# Patient Record
Sex: Female | Born: 1970
Health system: Southern US, Community
[De-identification: ages and names within clinical notes are randomized; demographics above are authoritative.]

## PROBLEM LIST (undated history)

## (undated) DIAGNOSIS — E118 Type 2 diabetes mellitus with unspecified complications: Secondary | ICD-10-CM

## (undated) DIAGNOSIS — I10 Essential (primary) hypertension: Secondary | ICD-10-CM

## (undated) DIAGNOSIS — K219 Gastro-esophageal reflux disease without esophagitis: Secondary | ICD-10-CM

## (undated) DIAGNOSIS — E039 Hypothyroidism, unspecified: Secondary | ICD-10-CM

## (undated) DIAGNOSIS — N926 Irregular menstruation, unspecified: Secondary | ICD-10-CM

## (undated) HISTORY — DX: Hypothyroidism, unspecified: E03.9

## (undated) HISTORY — DX: Gastro-esophageal reflux disease without esophagitis: K21.9

## (undated) HISTORY — DX: Irregular menstruation, unspecified: N92.6

## (undated) HISTORY — DX: Essential (primary) hypertension: I10

---

## 2005-11-13 ENCOUNTER — Ambulatory Visit: Payer: Self-pay

## 2005-12-01 ENCOUNTER — Other Ambulatory Visit: Payer: Self-pay

## 2005-12-01 ENCOUNTER — Emergency Department: Payer: Self-pay | Admitting: Emergency Medicine

## 2006-04-27 ENCOUNTER — Ambulatory Visit: Payer: Self-pay | Admitting: Internal Medicine

## 2006-05-01 ENCOUNTER — Ambulatory Visit: Payer: Self-pay | Admitting: Internal Medicine

## 2006-07-27 ENCOUNTER — Ambulatory Visit: Payer: Self-pay | Admitting: Internal Medicine

## 2007-07-01 ENCOUNTER — Ambulatory Visit: Payer: Self-pay | Admitting: Internal Medicine

## 2008-01-17 ENCOUNTER — Ambulatory Visit: Payer: Self-pay | Admitting: Family Medicine

## 2008-03-20 ENCOUNTER — Ambulatory Visit: Payer: Self-pay | Admitting: Internal Medicine

## 2008-07-27 ENCOUNTER — Ambulatory Visit: Payer: Self-pay | Admitting: Internal Medicine

## 2008-08-14 ENCOUNTER — Ambulatory Visit: Payer: Self-pay | Admitting: Internal Medicine

## 2008-10-13 ENCOUNTER — Ambulatory Visit: Payer: Self-pay | Admitting: Internal Medicine

## 2008-11-10 ENCOUNTER — Ambulatory Visit: Payer: Self-pay | Admitting: Gastroenterology

## 2010-02-11 ENCOUNTER — Ambulatory Visit: Payer: Self-pay | Admitting: Internal Medicine

## 2011-02-13 ENCOUNTER — Ambulatory Visit: Payer: Self-pay

## 2011-02-21 ENCOUNTER — Ambulatory Visit: Payer: Self-pay

## 2011-03-14 ENCOUNTER — Ambulatory Visit: Payer: Self-pay

## 2012-02-20 ENCOUNTER — Ambulatory Visit: Payer: Self-pay

## 2013-02-26 ENCOUNTER — Ambulatory Visit: Payer: Self-pay

## 2014-02-27 ENCOUNTER — Ambulatory Visit: Payer: Self-pay

## 2014-10-01 ENCOUNTER — Other Ambulatory Visit: Payer: Self-pay

## 2014-10-01 DIAGNOSIS — N83202 Unspecified ovarian cyst, left side: Secondary | ICD-10-CM

## 2014-10-02 ENCOUNTER — Ambulatory Visit (INDEPENDENT_AMBULATORY_CARE_PROVIDER_SITE_OTHER): Payer: 59 | Admitting: Obstetrics and Gynecology

## 2014-10-02 ENCOUNTER — Encounter: Payer: Self-pay | Admitting: Obstetrics and Gynecology

## 2014-10-02 ENCOUNTER — Ambulatory Visit: Payer: 59

## 2014-10-02 VITALS — BP 118/77 | HR 87 | Ht 66.0 in | Wt 128.9 lb

## 2014-10-02 DIAGNOSIS — N8320 Unspecified ovarian cysts: Secondary | ICD-10-CM

## 2014-10-02 DIAGNOSIS — D259 Leiomyoma of uterus, unspecified: Secondary | ICD-10-CM | POA: Insufficient documentation

## 2014-10-02 DIAGNOSIS — N83202 Unspecified ovarian cyst, left side: Secondary | ICD-10-CM

## 2014-10-02 DIAGNOSIS — E039 Hypothyroidism, unspecified: Secondary | ICD-10-CM

## 2014-10-02 DIAGNOSIS — N926 Irregular menstruation, unspecified: Secondary | ICD-10-CM

## 2014-10-02 DIAGNOSIS — I10 Essential (primary) hypertension: Secondary | ICD-10-CM | POA: Diagnosis not present

## 2014-10-02 NOTE — Patient Instructions (Signed)
Follow up as needed for menstrual irregularities.

## 2014-10-02 NOTE — Progress Notes (Signed)
   Subjective:    Patient ID: Rebekah Mclean, female    DOB: 1971/03/14, 44 y.o.   MRN: [id number]  HPI Comments: Patient presents for follow up of results of ultrasound of left ovarian cyst.  Still notes occasional "twinges" of pain, mild on left side, but otherwise doing well.     Review of Systems  All other systems reviewed and are negative.      Objective:   Physical Exam  Vitals reviewed. Exam deferred.   Pelvic Ultrasound Imaging (10/02/14):   Indications: Left ovarian cyst seen on outside imaging Findings:  The uterus measures 8.3 x 5.4 x 4.7 cm.  Echo texture is homogenous with evidence of a focal mass seen in the left fundus. Within the uterus is a suspected fibroid measuring: Fibroid 1: 3.5 x 2.4 x 3.7 cm, subserosal, left fundus.  The Endometrium measures 8.2 mm.  Right Ovary measures 3.6 x 1.3 x 1.9 cm. It is normal in appearance. Left Ovary measures 3.0 x 2.0 x 2.3 cm. A dominant follicle measuring 1.2 cm is seen. Survey of the adnexa demonstrates no adnexal masses. There is no free fluid in the cul de sac.  Impression: 1. Left fundal fibroid, subserosal. 2. Dominant left follicle. No large cystic structure seen on today's exam  Recommendations: 1.Clinical correlation with the patient's History and Physical Exam.     Assessment & Plan:  1) Left ovarian cyst - resolved.  Small dominant follicle seen.  2) Fibroid uterus - small fibroid noted.  No need for intervention at this time.  3) Patient previously with h/o 1 episode of irregular menses.  To f/u if continued irregularities.   Follow up as needed.    [name], MD [facility] 10/02/2014 3:01 PM

## 2015-03-29 ENCOUNTER — Other Ambulatory Visit: Payer: Self-pay | Admitting: Nurse Practitioner

## 2015-03-29 DIAGNOSIS — Z1231 Encounter for screening mammogram for malignant neoplasm of breast: Secondary | ICD-10-CM

## 2015-04-12 ENCOUNTER — Ambulatory Visit
Admission: RE | Admit: 2015-04-12 | Discharge: 2015-04-12 | Disposition: A | Discharge status: Home / Self Care | Payer: 59 | Source: Ambulatory Visit | Attending: Nurse Practitioner | Admitting: Nurse Practitioner

## 2015-04-12 DIAGNOSIS — Z1231 Encounter for screening mammogram for malignant neoplasm of breast: Secondary | ICD-10-CM | POA: Diagnosis present

## 2016-03-08 ENCOUNTER — Other Ambulatory Visit: Payer: Self-pay | Admitting: Internal Medicine

## 2016-03-08 DIAGNOSIS — Z1231 Encounter for screening mammogram for malignant neoplasm of breast: Secondary | ICD-10-CM

## 2016-04-18 ENCOUNTER — Ambulatory Visit
Admission: RE | Admit: 2016-04-18 | Discharge: 2016-04-18 | Disposition: A | Discharge status: Home / Self Care | Payer: 59 | Source: Ambulatory Visit | Attending: Internal Medicine | Admitting: Internal Medicine

## 2016-04-18 DIAGNOSIS — Z1231 Encounter for screening mammogram for malignant neoplasm of breast: Secondary | ICD-10-CM | POA: Insufficient documentation

## 2016-05-25 ENCOUNTER — Ambulatory Visit (INDEPENDENT_AMBULATORY_CARE_PROVIDER_SITE_OTHER): Payer: 59 | Admitting: Obstetrics and Gynecology

## 2016-05-25 ENCOUNTER — Encounter: Payer: Self-pay | Admitting: Obstetrics and Gynecology

## 2016-05-25 VITALS — BP 128/82 | HR 98 | Ht 66.0 in | Wt 135.4 lb

## 2016-05-25 DIAGNOSIS — N920 Excessive and frequent menstruation with regular cycle: Secondary | ICD-10-CM | POA: Diagnosis not present

## 2016-05-25 DIAGNOSIS — D259 Leiomyoma of uterus, unspecified: Secondary | ICD-10-CM | POA: Diagnosis not present

## 2016-05-25 NOTE — Progress Notes (Signed)
GYNECOLOGY PROGRESS NOTE  Subjective:    Patient ID: Rebekah Mclean, female    DOB: Feb 13, 1971, 46 y.o.   MRN: [id number]  HPI  Patient is a 46 y.o. G63P1001 female who presents for referral from PCP (Dr. Clayborn Bigness, [facility]) for ultrasound findings of fibroid enlargement and patient's complaints of heavier cycle. Patient has a h/o fibroids, was seen approximately 1.5 years ago for a left ovarian cyst where an ultrasound diagnosed her with fibroids.  At the time, the fibroids were not bothersome and menstrual cycle was regular.  Today patient notes that her periods have been heavier over the past several months.  Usually changes a pad q 2-3 hours, now changing one every 1.5 hours with passage of small clots.  States that this is not that bothersome to her.      The following portions of the patient's history were reviewed and updated as appropriate: allergies, current medications, past family history, past medical history, past social history, past surgical history and problem list.  Review of Systems Pertinent items are noted in HPI.   Objective:   Blood pressure 128/82, pulse 98, height 5\' 6"  (1.676 m), weight 135 lb 6.4 oz (61.4 kg), last menstrual period 05/24/2016. General appearance: alert and no distress Abdomen: soft, non-tender; bowel sounds normal; no masses,  no organomegaly Pelvic: external genitalia normal, rectovaginal septum normal.  Vagina without discharge.  Cervix normal appearing, no lesions and no motion tenderness.  Uterus mobile, nontender, normal shape and size.  Adnexae non-palpable, nontender bilaterally.  Extremities: extremities normal, atraumatic, no cyanosis or edema Neurologic: Grossly normal   Imaging:  Pelvic Ultrasound Imaging (03/02/2016):   Report: Uterus does not appear enlarged. There is a 4 cm fibroid in left side of the uterus.  Endometrial thickness appears to be normal, 2.6 millimeters. Ovaries are unremarkable.  No pelvic masses or  free fluid identified.     Pelvic Ultrasound Imaging (10/02/14):   Indications: Left ovarian cyst seen on outside imaging Findings:  The uterus measures 8.3 x 5.4 x 4.7 cm.  Echo texture is homogenous with evidence of a focal mass seen in the left fundus. Within the uterus is a suspected fibroid measuring: Fibroid 1: 3.5 x 2.4 x 3.7 cm, subserosal, left fundus.  The Endometrium measures 8.2 mm.  Right Ovary measures 3.6 x 1.3 x 1.9 cm. It is normal in appearance. Left Ovary measures 3.0 x 2.0 x 2.3 cm. A dominant follicle measuring 1.2 cm is seen. Survey of the adnexa demonstrates no adnexal masses. There is no free fluid in the cul de sac.  Impression: 1. Left fundal fibroid, subserosal. 2. Dominant left follicle. No large cystic structure seen on today's exam  Recommendations: 1.Clinical correlation with the patient's History and Physical Exam.  Assessment:   Fibroid uterus Menorrhagia  Plan:   1. Fibroid uterus, with only slight change in size of over the past year ( from ~ 3.5 cm to 4 cm).  Is no cause for concern if not bothersome.  2. Menorrhagia reported by patient, noting an increase in flow over the past few months.  Patient notes that although she is aware of the change, it is not bothersome enough that she would desire management. Advised that bleeding may be secondary to perimenopausal bleeding, or due to the fibroid.  Discussed management options with patient, given handout to review.  Patient reportedly had labs from PCP that she has not yet had performed (including CBC and TSH). Encouraged patient to  have labs performed as soon as possible, and to have a copy sent to the office. Once labs are received, patient desires to discuss further management. Will likely need further evaluation with endometrial biopsy prior to initiation of therapy.    [name], MD [facility]

## 2016-08-16 ENCOUNTER — Encounter: Payer: Self-pay | Admitting: *Deleted

## 2016-08-16 ENCOUNTER — Ambulatory Visit
Admission: EM | Admit: 2016-08-16 | Discharge: 2016-08-16 | Disposition: A | Discharge status: Home / Self Care | Payer: 59 | Attending: Family Medicine | Admitting: Family Medicine

## 2016-08-16 DIAGNOSIS — R0981 Nasal congestion: Secondary | ICD-10-CM

## 2016-08-16 DIAGNOSIS — J301 Allergic rhinitis due to pollen: Secondary | ICD-10-CM

## 2016-08-16 DIAGNOSIS — R05 Cough: Secondary | ICD-10-CM

## 2016-08-16 DIAGNOSIS — J029 Acute pharyngitis, unspecified: Secondary | ICD-10-CM | POA: Diagnosis not present

## 2016-08-16 DIAGNOSIS — R059 Cough, unspecified: Secondary | ICD-10-CM

## 2016-08-16 LAB — RAPID STREP SCREEN (MED CTR MEBANE ONLY): STREPTOCOCCUS, GROUP A SCREEN (DIRECT): NEGATIVE

## 2016-08-16 MED ORDER — PREDNISONE 10 MG (21) PO TBPK: ORAL_TABLET | ORAL | 0 refills | Status: DC

## 2016-08-16 MED ORDER — HYDROCOD POLST-CPM POLST ER 10-8 MG/5ML PO SUER: 5.0000 mL | Freq: Two times a day (BID) | ORAL | 0 refills | Status: DC | PRN

## 2016-08-16 MED ORDER — FEXOFENADINE-PSEUDOEPHED ER 180-240 MG PO TB24: 1.0000 | ORAL_TABLET | Freq: Every day | ORAL | 0 refills | Status: DC

## 2016-08-16 NOTE — ED Triage Notes (Signed)
PAtient started having symptoms of cough, sore throat, and nasal congestion yesterday.

## 2016-08-16 NOTE — ED Provider Notes (Signed)
[facility]    CSN: [id number] Arrival date & time: 08/16/16  1858     History   Chief Complaint Chief Complaint  Patient presents with  . Sore Throat  . Cough  . Nasal Congestion    HPI Rebekah Mclean is a 46 y.o. female.   Patient is a 46 year old black female with cough and congestion that started about 2-3 weeks ago. At that time she thought she had the flu and soon after she started coughing and she's continued to cough. Yesterday she had postnasal drainage and a sore throat as well. She is not allergic to any medication she does not smoke she has history hypertension. No pertinent family medical history and no pertinent surgeries or operations.   The history is provided by the patient and the spouse.  Sore Throat  This is a new problem. The current episode started yesterday. The problem has not changed since onset.Pertinent negatives include no chest pain, no abdominal pain and no headaches. Nothing aggravates the symptoms. She has tried nothing for the symptoms. The treatment provided no relief.  Cough  Associated symptoms: no chest pain and no headaches     Past Medical History:  Diagnosis Date  . Hypertension   . Hypothyroidism   . Irregular menses     Patient Active Problem List   Diagnosis Date Noted  . Hypertension 10/02/2014  . Hypothyroidism 10/02/2014  . Irregular menses 10/02/2014  . Left ovarian cyst 10/02/2014  . Fibroid uterus 10/02/2014    History reviewed. No pertinent surgical history.  OB History    Gravida Para Term Preterm AB Living   1 1 1     1    SAB TAB Ectopic Multiple Live Births           1       Home Medications    Prior to Admission medications   Medication Sig Start Date End Date Taking? Authorizing Provider  levothyroxine (SYNTHROID, LEVOTHROID) 88 MCG tablet Take 88 mcg by mouth daily before breakfast.   Yes Historical Provider, MD  valsartan (DIOVAN) 80 MG tablet Take 80 mg by mouth daily.   Yes Historical  Provider, MD  chlorpheniramine-HYDROcodone (TUSSIONEX PENNKINETIC ER) 10-8 MG/5ML SUER Take 5 mLs by mouth every 12 (twelve) hours as needed. 08/16/16   Frederich Cha, MD  fexofenadine-pseudoephedrine (ALLEGRA-D ALLERGY & CONGESTION) 180-240 MG 24 hr tablet Take 1 tablet by mouth daily. 08/16/16   Frederich Cha, MD  predniSONE (STERAPRED UNI-PAK 21 TAB) 10 MG (21) TBPK tablet 6 tabs day 1 and 2, 5 tabs day 3 and 4, 4 tabs day 5 and 6, 3 tabs day 7 and 8, 2 tabs day 9 and 10, 1 tab day 11 and 12. Take orally 08/16/16   Frederich Cha, MD    Family History Family History  Problem Relation Age of Onset  . Diabetes Mother   . Hypertension Mother   . Diabetes Father   . Hypertension Father   . Breast cancer Neg Hx     Social History Social History  Substance Use Topics  . Smoking status: Never Smoker  . Smokeless tobacco: Never Used  . Alcohol use No     Allergies   Patient has no known allergies.   Review of Systems Review of Systems  Respiratory: Positive for cough.   Cardiovascular: Negative for chest pain.  Gastrointestinal: Negative for abdominal pain.  Neurological: Negative for headaches.  All other systems reviewed and are negative.    Physical  Exam Triage Vital Signs ED Triage Vitals  Enc Vitals Group     BP 08/16/16 1909 135/88     Pulse Rate 08/16/16 1909 (!) 107     Resp 08/16/16 1909 18     Temp 08/16/16 1909 99.1 F (37.3 C)     Temp Source 08/16/16 1909 Oral     SpO2 08/16/16 1909 100 %     Weight 08/16/16 1910 135 lb (61.2 kg)     Height 08/16/16 1910 5\' 6"  (1.676 m)     Head Circumference --      Peak Flow --      Pain Score 08/16/16 1912 0     Pain Loc --      Pain Edu? --      Excl. in [place]? --    No data found.   Updated Vital Signs BP 135/88 (BP Location: Left Arm)   Pulse (!) 107   Temp 99.1 F (37.3 C) (Oral)   Resp 18   Ht 5\' 6"  (1.676 m)   Wt 135 lb (61.2 kg)   LMP 08/11/2016   SpO2 100%   BMI 21.79 kg/m   Visual Acuity Right Eye  Distance:   Left Eye Distance:   Bilateral Distance:    Right Eye Near:   Left Eye Near:    Bilateral Near:     Physical Exam  Constitutional: She appears well-developed and well-nourished.  HENT:  Head: Normocephalic and atraumatic.  Right Ear: Hearing, tympanic membrane, external ear and ear canal normal.  Left Ear: Hearing, tympanic membrane, external ear and ear canal normal.  Nose: Mucosal edema present. Right sinus exhibits no maxillary sinus tenderness and no frontal sinus tenderness. Left sinus exhibits no maxillary sinus tenderness and no frontal sinus tenderness.  Mouth/Throat: Uvula is midline. Posterior oropharyngeal erythema present.  Eyes: Pupils are equal, round, and reactive to light.  Neck: Normal range of motion. Neck supple. No tracheal deviation present.  Cardiovascular: Normal rate and regular rhythm.   Pulmonary/Chest: Effort normal and breath sounds normal.  Musculoskeletal: Normal range of motion.  Lymphadenopathy:    She has cervical adenopathy.  Neurological: She is alert.  Skin: Skin is warm.  Psychiatric: She has a normal mood and affect.  Vitals reviewed.    UC Treatments / Results  Labs (all labs ordered are listed, but only abnormal results are displayed) Labs Reviewed  RAPID STREP SCREEN (NOT AT [facility])  CULTURE, GROUP A STREP [facility])    EKG  EKG Interpretation None       Radiology No results found.  Procedures Procedures (including critical care time)  Medications Ordered in UC Medications - No data to display   Initial Impression / Assessment and Plan / UC Course  I have reviewed the triage vital signs and the nursing notes.  Pertinent labs & imaging results that were available during my care of the patient were reviewed by me and considered in my medical decision making (see chart for details).     With exam being said basically normal think the patient is having more of a reaction to allergens and seasonal allergies and  actual dissector infection from the flu. That Recommending she use different technique in using her Flonase No. 2 oh place on a six-day course of prednisone there were 3 Allegra-D No. 4 Tussionex 1 teaspoon twice daily as needed when necessary for cough and work note given for tomorrow. Follow-up PCP if not better.  Final Clinical Impressions(s) / UC Diagnoses  Final diagnoses:  Cough  Nasal congestion  Pharyngitis, unspecified etiology  Seasonal allergic rhinitis due to pollen    New Prescriptions Discharge Medication List as of 08/16/2016  7:43 PM    START taking these medications   Details  chlorpheniramine-HYDROcodone (TUSSIONEX PENNKINETIC ER) 10-8 MG/5ML SUER Take 5 mLs by mouth every 12 (twelve) hours as needed., Starting Wed 08/16/2016, Normal    fexofenadine-pseudoephedrine (ALLEGRA-D ALLERGY & CONGESTION) 180-240 MG 24 hr tablet Take 1 tablet by mouth daily., Starting Wed 08/16/2016, Normal    predniSONE (STERAPRED UNI-PAK 21 TAB) 10 MG (21) TBPK tablet 6 tabs day 1 and 2, 5 tabs day 3 and 4, 4 tabs day 5 and 6, 3 tabs day 7 and 8, 2 tabs day 9 and 10, 1 tab day 11 and 12. Take orally, Normal        Note: This dictation was prepared with Dragon dictation along with smaller phrase technology. Any transcriptional errors that result from this process are unintentional.   Frederich Cha, MD 08/16/16 2032

## 2016-08-19 LAB — CULTURE, GROUP A STREP (THRC)

## 2017-03-13 ENCOUNTER — Other Ambulatory Visit: Payer: Self-pay | Admitting: Nurse Practitioner

## 2017-03-13 DIAGNOSIS — Z1231 Encounter for screening mammogram for malignant neoplasm of breast: Secondary | ICD-10-CM

## 2017-04-19 ENCOUNTER — Ambulatory Visit
Admission: RE | Admit: 2017-04-19 | Discharge: 2017-04-19 | Disposition: A | Discharge status: Home / Self Care | Payer: 59 | Source: Ambulatory Visit | Attending: Nurse Practitioner | Admitting: Nurse Practitioner

## 2017-04-19 DIAGNOSIS — Z1231 Encounter for screening mammogram for malignant neoplasm of breast: Secondary | ICD-10-CM | POA: Diagnosis present

## 2017-05-17 ENCOUNTER — Encounter: Payer: Self-pay | Admitting: Nurse Practitioner

## 2017-05-28 ENCOUNTER — Other Ambulatory Visit: Payer: Self-pay

## 2017-05-28 MED ORDER — VALSARTAN-HYDROCHLOROTHIAZIDE 160-12.5 MG PO TABS: 1.0000 | ORAL_TABLET | Freq: Every day | ORAL | 2 refills | Status: DC

## 2017-05-29 ENCOUNTER — Encounter: Payer: Self-pay | Admitting: Internal Medicine

## 2017-06-26 ENCOUNTER — Encounter: Payer: Self-pay | Admitting: Nurse Practitioner

## 2017-06-26 ENCOUNTER — Ambulatory Visit (INDEPENDENT_AMBULATORY_CARE_PROVIDER_SITE_OTHER): Payer: 59 | Admitting: Nurse Practitioner

## 2017-06-26 VITALS — BP 140/82 | HR 72 | Resp 16 | Ht 66.0 in | Wt 142.0 lb

## 2017-06-26 DIAGNOSIS — D259 Leiomyoma of uterus, unspecified: Secondary | ICD-10-CM

## 2017-06-26 DIAGNOSIS — E039 Hypothyroidism, unspecified: Secondary | ICD-10-CM

## 2017-06-26 DIAGNOSIS — Z0001 Encounter for general adult medical examination with abnormal findings: Secondary | ICD-10-CM | POA: Diagnosis not present

## 2017-06-26 DIAGNOSIS — I10 Essential (primary) hypertension: Secondary | ICD-10-CM | POA: Diagnosis not present

## 2017-06-26 DIAGNOSIS — R300 Dysuria: Secondary | ICD-10-CM | POA: Diagnosis not present

## 2017-06-26 NOTE — Progress Notes (Signed)
[facility] [place], [place] [address]  Internal MEDICINE  Office Visit Note  Patient Name: Rebekah Mclean  [id number]  [id number]  Date of Service: 07/15/2017  Chief Complaint  Patient presents with  . Hypertension     Hypertension  This is a chronic problem. The current episode started more than 1 year ago. The problem is unchanged. The problem is controlled. Pertinent negatives include no chest pain, headaches, neck pain, palpitations or shortness of breath. There are no associated agents to hypertension. Risk factors for coronary artery disease include stress and dyslipidemia. Past treatments include beta blockers and angiotensin blockers. The current treatment provides moderate improvement. There are no compliance problems.  Hypertensive end-organ damage includes left ventricular hypertrophy.   Pt is here for routine health maintenance examination  Current Medication: Outpatient Encounter Medications as of 06/26/2017  Medication Sig  . acyclovir (ZOVIRAX) 400 MG tablet Take 400 mg by mouth 5 (five) times daily.  Marland Kitchen atorvastatin (LIPITOR) 10 MG tablet Take 10 mg by mouth daily.  . carvedilol (COREG) 3.125 MG tablet Take 3.125 mg by mouth 2 (two) times daily with a meal.  . fluticasone (FLONASE) 50 MCG/ACT nasal spray Place 1 spray into both nostrils daily.  Marland Kitchen ibuprofen (ADVIL,MOTRIN) 800 MG tablet Take 800 mg by mouth 3 (three) times daily as needed.  Marland Kitchen levothyroxine (SYNTHROID, LEVOTHROID) 100 MCG tablet Take 100 mcg by mouth daily before breakfast.  . valsartan-hydrochlorothiazide (DIOVAN-HCT) 160-12.5 MG tablet Take 1 tablet by mouth daily.  . [DISCONTINUED] chlorpheniramine-HYDROcodone (TUSSIONEX PENNKINETIC ER) 10-8 MG/5ML SUER Take 5 mLs by mouth every 12 (twelve) hours as needed. (Patient not taking: Reported on 06/26/2017)  . [DISCONTINUED] fexofenadine-pseudoephedrine (ALLEGRA-D ALLERGY & CONGESTION) 180-240 MG 24 hr tablet Take 1 tablet by mouth  daily. (Patient not taking: Reported on 06/26/2017)  . [DISCONTINUED] levothyroxine (SYNTHROID, LEVOTHROID) 88 MCG tablet Take 88 mcg by mouth daily before breakfast.  . [DISCONTINUED] predniSONE (STERAPRED UNI-PAK 21 TAB) 10 MG (21) TBPK tablet 6 tabs day 1 and 2, 5 tabs day 3 and 4, 4 tabs day 5 and 6, 3 tabs day 7 and 8, 2 tabs day 9 and 10, 1 tab day 11 and 12. Take orally   No facility-administered encounter medications on file as of 06/26/2017.     Surgical History: No past surgical history on file.  Medical History: Past Medical History:  Diagnosis Date  . GERD (gastroesophageal reflux disease)   . Hypertension   . Hypothyroidism   . Irregular menses     Family History: Family History  Problem Relation Age of Onset  . Diabetes Mother   . Hypertension Mother   . Diabetes Father   . Hypertension Father   . Breast cancer Neg Hx       Review of Systems  Constitutional: Negative for activity change, chills, fatigue and unexpected weight change.  HENT: Negative for congestion, postnasal drip, rhinorrhea, sneezing and sore throat.   Eyes: Negative.  Negative for redness.  Respiratory: Negative for cough, chest tightness and shortness of breath.   Cardiovascular: Negative for chest pain and palpitations.  Gastrointestinal: Negative for abdominal pain, constipation, diarrhea, nausea and vomiting.  Endocrine: Negative for cold intolerance, heat intolerance, polydipsia, polyphagia and polyuria.  Genitourinary: Negative for dysuria and frequency.  Musculoskeletal: Negative for arthralgias, back pain, joint swelling and neck pain.  Skin: Negative for rash.  Allergic/Immunologic: Negative for environmental allergies.  Neurological: Negative for tremors, numbness and headaches.  Hematological: Negative for adenopathy. Does  not bruise/bleed easily.  Psychiatric/Behavioral: Negative for behavioral problems (Depression), sleep disturbance and suicidal ideas. The patient is not  nervous/anxious.      Today's Vitals   06/26/17 1132  BP: 140/82  Pulse: 72  Resp: 16  SpO2: 99%  Weight: 142 lb (64.4 kg)  Height: 5\' 6"  (1.676 m)    Physical Exam  Constitutional: She is oriented to person, place, and time. She appears well-developed and well-nourished. No distress.  HENT:  Head: Normocephalic and atraumatic.  Mouth/Throat: Oropharynx is clear and moist. No oropharyngeal exudate.  Eyes: Pupils are equal, round, and reactive to light. EOM are normal.  Neck: Normal range of motion. Neck supple. No JVD present. Carotid bruit is not present. No tracheal deviation present. No thyromegaly present.  Cardiovascular: Normal rate, regular rhythm, normal heart sounds and intact distal pulses. Exam reveals no gallop and no friction rub.  No murmur heard. Pulmonary/Chest: Effort normal and breath sounds normal. No respiratory distress. She has no wheezes. She has no rales. She exhibits no tenderness.  Abdominal: Soft. Bowel sounds are normal. There is no tenderness.  Musculoskeletal: Normal range of motion.  Lymphadenopathy:    She has no cervical adenopathy.  Neurological: She is alert and oriented to person, place, and time. No cranial nerve deficit.  Skin: Skin is warm and dry. She is not diaphoretic.  Psychiatric: She has a normal mood and affect. Her behavior is normal. Judgment and thought content normal.  Nursing note and vitals reviewed.    LABS: Recent Results (from the past 2160 hour(s))  Urinalysis, Routine w reflex microscopic     Status: Abnormal   Collection Time: 06/26/17 11:06 AM  Result Value Ref Range   Specific Gravity, UA 1.026 1.005 - 1.030   pH, UA 5.5 5.0 - 7.5   Color, UA Yellow Yellow   Appearance Ur Clear Clear   Leukocytes, UA Negative Negative   Protein, UA Negative Negative/Trace   Glucose, UA Negative Negative   Ketones, UA Trace (A) Negative   RBC, UA Negative Negative   Bilirubin, UA Negative Negative   Urobilinogen, Ur 0.2 0.2 -  1.0 mg/dL   Nitrite, UA Negative Negative   Microscopic Examination Comment     Comment: Microscopic not indicated and not performed.    Assessment/Plan: 1. Encounter for general adult medical examination with abnormal findings Annual wellness visit today  2. Essential hypertension Stable. Continue bp medication as prescribed.   3. Hypothyroidism, unspecified type Continue regular visits with endocrinology as scheduled.   4. Uterine leiomyoma, unspecified location Now seeing GYN for continued evaluation and treatment.   5. Dysuria - Urinalysis, Routine w reflex microscopic  General Counseling: Khadejah verbalizes understanding of the findings of todays visit and agrees with plan of treatment. I have discussed any further diagnostic evaluation that may be needed or ordered today. We also reviewed her medications today. she has been encouraged to call the office with any questions or concerns that should arise related to todays visit.   Hypertension Counseling:   The following hypertensive lifestyle modification were recommended and discussed:  1. Limiting alcohol intake to less than 1 oz/day of ethanol:(24 oz of beer or 8 oz of wine or 2 oz of 100-proof whiskey). 2. Take baby ASA 81 mg daily. 3. Importance of regular aerobic exercise and losing weight. 4. Reduce dietary saturated fat and cholesterol intake for overall cardiovascular health. 5. Maintaining adequate dietary potassium, calcium, and magnesium intake. 6. Regular monitoring of the blood pressure. 7. Reduce  sodium intake to less than 100 mmol/day (less than 2.3 gm of sodium or less than 6 gm of sodium choride)   This patient was seen by Leretha Pol, FNP- C in Collaboration with Dr Lavera Guise as a part of collaborative care agreement   Orders Placed This Encounter  Procedures  . Urinalysis, Routine w reflex microscopic     Time spent: [place], MD  Internal Medicine

## 2017-06-27 LAB — URINALYSIS, ROUTINE W REFLEX MICROSCOPIC
Bilirubin, UA: NEGATIVE
Glucose, UA: NEGATIVE
LEUKOCYTES UA: NEGATIVE
Nitrite, UA: NEGATIVE
PH UA: 5.5 (ref 5.0–7.5)
PROTEIN UA: NEGATIVE
RBC, UA: NEGATIVE
Specific Gravity, UA: 1.026 (ref 1.005–1.030)
Urobilinogen, Ur: 0.2 mg/dL (ref 0.2–1.0)

## 2017-07-26 ENCOUNTER — Other Ambulatory Visit: Payer: Self-pay | Admitting: Nurse Practitioner

## 2017-07-26 MED ORDER — VALSARTAN-HYDROCHLOROTHIAZIDE 160-12.5 MG PO TABS: 1.0000 | ORAL_TABLET | Freq: Every day | ORAL | 2 refills | Status: DC

## 2017-10-02 ENCOUNTER — Other Ambulatory Visit: Payer: Self-pay

## 2017-10-02 MED ORDER — ACYCLOVIR 400 MG PO TABS: 400.0000 mg | ORAL_TABLET | Freq: Every day | ORAL | 3 refills | Status: DC

## 2017-11-28 ENCOUNTER — Other Ambulatory Visit: Payer: Self-pay

## 2017-11-28 MED ORDER — VALSARTAN-HYDROCHLOROTHIAZIDE 160-12.5 MG PO TABS: 1.0000 | ORAL_TABLET | Freq: Every day | ORAL | 2 refills | Status: DC

## 2017-12-19 ENCOUNTER — Telehealth: Payer: Self-pay

## 2017-12-19 ENCOUNTER — Ambulatory Visit: Payer: Self-pay | Admitting: Adult Health

## 2017-12-19 NOTE — Telephone Encounter (Signed)
Called patient and let her know that her Staff Health Assessment/Medical Report was completed and ready to be picked up

## 2017-12-21 ENCOUNTER — Other Ambulatory Visit: Payer: Self-pay | Admitting: Nurse Practitioner

## 2017-12-22 LAB — COMPREHENSIVE METABOLIC PANEL
ALBUMIN: 4.6 g/dL (ref 3.5–5.5)
ALT: 20 IU/L (ref 0–32)
AST: 28 IU/L (ref 0–40)
Albumin/Globulin Ratio: 1.4 (ref 1.2–2.2)
Alkaline Phosphatase: 58 IU/L (ref 39–117)
BILIRUBIN TOTAL: 0.2 mg/dL (ref 0.0–1.2)
BUN / CREAT RATIO: 13 (ref 9–23)
BUN: 12 mg/dL (ref 6–24)
CO2: 23 mmol/L (ref 20–29)
CREATININE: 0.96 mg/dL (ref 0.57–1.00)
Calcium: 9.3 mg/dL (ref 8.7–10.2)
Chloride: 99 mmol/L (ref 96–106)
GFR calc non Af Amer: 71 mL/min/{1.73_m2} (ref >59)
GFR, EST AFRICAN AMERICAN: 81 mL/min/{1.73_m2} (ref >59)
GLOBULIN, TOTAL: 3.3 g/dL (ref 1.5–4.5)
Glucose: 117 mg/dL — ABNORMAL HIGH (ref 65–99)
Potassium: 4 mmol/L (ref 3.5–5.2)
SODIUM: 138 mmol/L (ref 134–144)
TOTAL PROTEIN: 7.9 g/dL (ref 6.0–8.5)

## 2017-12-22 LAB — LIPID PANEL W/O CHOL/HDL RATIO
Cholesterol, Total: 309 mg/dL — ABNORMAL HIGH (ref 100–199)
HDL: 55 mg/dL (ref >39)
LDL CALC: 215 mg/dL — AB (ref 0–99)
TRIGLYCERIDES: 197 mg/dL — AB (ref 0–149)
VLDL Cholesterol Cal: 39 mg/dL (ref 5–40)

## 2017-12-22 LAB — CBC
HEMATOCRIT: 37.9 % (ref 34.0–46.6)
Hemoglobin: 12.6 g/dL (ref 11.1–15.9)
MCH: 27.3 pg (ref 26.6–33.0)
MCHC: 33.2 g/dL (ref 31.5–35.7)
MCV: 82 fL (ref 79–97)
Platelets: 242 10*3/uL (ref 150–450)
RBC: 4.62 x10E6/uL (ref 3.77–5.28)
RDW: 13.4 % (ref 12.3–15.4)
WBC: 9.5 10*3/uL (ref 3.4–10.8)

## 2017-12-22 LAB — TSH: TSH: 138 u[IU]/mL — ABNORMAL HIGH (ref 0.450–4.500)

## 2017-12-22 LAB — T4, FREE: FREE T4: 0.27 ng/dL — AB (ref 0.82–1.77)

## 2017-12-22 LAB — VITAMIN D 25 HYDROXY (VIT D DEFICIENCY, FRACTURES): Vit D, 25-Hydroxy: 14.3 ng/mL — ABNORMAL LOW (ref 30.0–100.0)

## 2017-12-26 ENCOUNTER — Telehealth: Payer: Self-pay

## 2017-12-26 NOTE — Telephone Encounter (Signed)
lmom to call us back

## 2017-12-27 ENCOUNTER — Other Ambulatory Visit: Payer: Self-pay

## 2017-12-27 ENCOUNTER — Other Ambulatory Visit: Payer: Self-pay | Admitting: Nurse Practitioner

## 2017-12-27 ENCOUNTER — Telehealth: Payer: Self-pay

## 2017-12-27 ENCOUNTER — Ambulatory Visit: Payer: Self-pay | Admitting: Nurse Practitioner

## 2017-12-27 DIAGNOSIS — E039 Hypothyroidism, unspecified: Secondary | ICD-10-CM

## 2017-12-27 MED ORDER — LEVOTHYROXINE SODIUM 75 MCG PO TABS: 75.0000 ug | ORAL_TABLET | Freq: Every day | ORAL | 3 refills | Status: DC

## 2017-12-27 MED ORDER — LEVOTHYROXINE SODIUM 75 MCG PO TABS: 75.0000 ug | ORAL_TABLET | Freq: Every day | ORAL | 1 refills | Status: DC

## 2017-12-27 NOTE — Progress Notes (Signed)
Added levothyroxine at 58mcg daily. I accidentally sent it to [facility] but can be sent to [facility]. She can start this tomorrow am and we will discuss further at next visit.

## 2017-12-27 NOTE — Telephone Encounter (Signed)
Pt advised for labs and advised we send levothyroxine to her phar and discuss in detailed of labs on visit

## 2017-12-27 NOTE — Progress Notes (Signed)
Added levothyroxine at 68mcg daily. I accidentally sent it to [facility] but can be sent to [facility]. She can start this tomorrow am and we will discuss further at next visit.

## 2018-01-03 ENCOUNTER — Ambulatory Visit: Payer: 59 | Admitting: Nurse Practitioner

## 2018-01-03 ENCOUNTER — Encounter: Payer: Self-pay | Admitting: Nurse Practitioner

## 2018-01-03 VITALS — BP 137/94 | HR 92 | Resp 16 | Ht 66.0 in | Wt 140.8 lb

## 2018-01-03 DIAGNOSIS — E782 Mixed hyperlipidemia: Secondary | ICD-10-CM

## 2018-01-03 DIAGNOSIS — J300 Vasomotor rhinitis: Secondary | ICD-10-CM

## 2018-01-03 DIAGNOSIS — E039 Hypothyroidism, unspecified: Secondary | ICD-10-CM | POA: Diagnosis not present

## 2018-01-03 DIAGNOSIS — B001 Herpesviral vesicular dermatitis: Secondary | ICD-10-CM

## 2018-01-03 DIAGNOSIS — E559 Vitamin D deficiency, unspecified: Secondary | ICD-10-CM

## 2018-01-03 DIAGNOSIS — I10 Essential (primary) hypertension: Secondary | ICD-10-CM

## 2018-01-03 MED ORDER — VALSARTAN-HYDROCHLOROTHIAZIDE 160-12.5 MG PO TABS: 1.0000 | ORAL_TABLET | Freq: Every day | ORAL | 2 refills | Status: DC

## 2018-01-03 MED ORDER — FLUTICASONE PROPIONATE 50 MCG/ACT NA SUSP: 2.0000 | Freq: Every day | NASAL | 5 refills | Status: DC

## 2018-01-03 MED ORDER — ROSUVASTATIN CALCIUM 5 MG PO TABS: 5.0000 mg | ORAL_TABLET | Freq: Every day | ORAL | 3 refills | Status: DC

## 2018-01-03 MED ORDER — ERGOCALCIFEROL 1.25 MG (50000 UT) PO CAPS: 50000.0000 [IU] | ORAL_CAPSULE | ORAL | 5 refills | Status: DC

## 2018-01-03 MED ORDER — ACYCLOVIR 400 MG PO TABS: 400.0000 mg | ORAL_TABLET | Freq: Every day | ORAL | 3 refills | Status: DC

## 2018-01-03 MED ORDER — CARVEDILOL 3.125 MG PO TABS: 3.1250 mg | ORAL_TABLET | Freq: Two times a day (BID) | ORAL | 5 refills | Status: DC

## 2018-01-03 NOTE — Progress Notes (Signed)
[facility] [place], [place] [address]  Internal MEDICINE  Office Visit Note  Patient Name: Rebekah Mclean  [id number]  [id number]  Date of Service: 01/13/2018  Chief Complaint  Patient presents with  . Hypertension    6 month follow up  . Hypothyroidism    The patient has history of hypothyroid. She had been seeing endocrinology, but has not been in some time. Has not been taking her levothyroxine for a few months. Recently had her labs drawn and showed her to be significantly hypothyroid.   Hypertension  This is a chronic problem. The current episode started more than 1 year ago. The problem is unchanged. The problem is controlled. Pertinent negatives include no chest pain, headaches, neck pain, palpitations or shortness of breath. Agents associated with hypertension include thyroid hormones. Risk factors for coronary artery disease include dyslipidemia, post-menopausal state and stress. Past treatments include ACE inhibitors, beta blockers and diuretics. The current treatment provides moderate improvement.       Current Medication: Outpatient Encounter Medications as of 01/03/2018  Medication Sig  . acyclovir (ZOVIRAX) 400 MG tablet Take 1 tablet (400 mg total) by mouth 5 (five) times daily.  . carvedilol (COREG) 3.125 MG tablet Take 1 tablet (3.125 mg total) by mouth 2 (two) times daily with a meal.  . fluticasone (FLONASE) 50 MCG/ACT nasal spray Place 2 sprays into both nostrils daily.  Marland Kitchen ibuprofen (ADVIL,MOTRIN) 800 MG tablet Take 800 mg by mouth 3 (three) times daily as needed.  Marland Kitchen levothyroxine (SYNTHROID, LEVOTHROID) 75 MCG tablet Take 1 tablet (75 mcg total) by mouth daily before breakfast.  . valsartan-hydrochlorothiazide (DIOVAN-HCT) 160-12.5 MG tablet Take 1 tablet by mouth daily.  . [DISCONTINUED] acyclovir (ZOVIRAX) 400 MG tablet Take 1 tablet (400 mg total) by mouth 5 (five) times daily.  . [DISCONTINUED] atorvastatin (LIPITOR) 10 MG tablet  Take 10 mg by mouth daily.  . [DISCONTINUED] carvedilol (COREG) 3.125 MG tablet Take 3.125 mg by mouth 2 (two) times daily with a meal.  . [DISCONTINUED] fluticasone (FLONASE) 50 MCG/ACT nasal spray Place 1 spray into both nostrils daily.  . [DISCONTINUED] valsartan-hydrochlorothiazide (DIOVAN-HCT) 160-12.5 MG tablet Take 1 tablet by mouth daily.  . ergocalciferol (DRISDOL) 50000 units capsule Take 1 capsule (50,000 Units total) by mouth once a week.  . rosuvastatin (CRESTOR) 5 MG tablet Take 1 tablet (5 mg total) by mouth daily.   No facility-administered encounter medications on file as of 01/03/2018.     Surgical History: History reviewed. No pertinent surgical history.  Medical History: Past Medical History:  Diagnosis Date  . GERD (gastroesophageal reflux disease)   . Hypertension   . Hypothyroidism   . Irregular menses     Family History: Family History  Problem Relation Age of Onset  . Diabetes Mother   . Hypertension Mother   . Diabetes Father   . Hypertension Father   . Breast cancer Neg Hx     Social History   Socioeconomic History  . Marital status: Married    Spouse name: Not on file  . Number of children: Not on file  . Years of education: Not on file  . Highest education level: Not on file  Occupational History  . Not on file  Social Needs  . Financial resource strain: Not on file  . Food insecurity:    Worry: Not on file    Inability: Not on file  . Transportation needs:    Medical: Not on file  Non-medical: Not on file  Tobacco Use  . Smoking status: Never Smoker  . Smokeless tobacco: Never Used  Substance and Sexual Activity  . Alcohol use: No  . Drug use: No  . Sexual activity: Yes    Birth control/protection: None  Lifestyle  . Physical activity:    Days per week: Not on file    Minutes per session: Not on file  . Stress: Not on file  Relationships  . Social connections:    Talks on phone: Not on file    Gets together: Not on file     Attends religious service: Not on file    Active member of club or organization: Not on file    Attends meetings of clubs or organizations: Not on file    Relationship status: Not on file  . Intimate partner violence:    Fear of current or ex partner: Not on file    Emotionally abused: Not on file    Physically abused: Not on file    Forced sexual activity: Not on file  Other Topics Concern  . Not on file  Social History Narrative  . Not on file      Review of Systems  Constitutional: Positive for fatigue. Negative for activity change, chills and unexpected weight change.  HENT: Negative for congestion, postnasal drip, rhinorrhea, sneezing and sore throat.   Eyes: Negative.  Negative for redness.  Respiratory: Negative for cough, chest tightness, shortness of breath and wheezing.   Cardiovascular: Negative for chest pain and palpitations.  Gastrointestinal: Negative for abdominal pain, constipation, diarrhea, nausea and vomiting.  Endocrine: Negative for cold intolerance, heat intolerance, polydipsia, polyphagia and polyuria.       Uncontrolled thyroid disease.  Genitourinary: Negative for dysuria and frequency.  Musculoskeletal: Negative for arthralgias, back pain, joint swelling and neck pain.  Skin: Negative for rash.  Allergic/Immunologic: Negative for environmental allergies.  Neurological: Negative for tremors, numbness and headaches.  Hematological: Negative for adenopathy. Does not bruise/bleed easily.  Psychiatric/Behavioral: Negative for behavioral problems (Depression), sleep disturbance and suicidal ideas. The patient is not nervous/anxious.     Today's Vitals   01/03/18 1611  BP: (!) 137/94  Pulse: 92  Resp: 16  SpO2: 100%  Weight: 140 lb 12.8 oz (63.9 kg)  Height: 5\' 6"  (1.676 m)    Physical Exam  Constitutional: She is oriented to person, place, and time. She appears well-developed and well-nourished. No distress.  HENT:  Head: Normocephalic and  atraumatic.  Nose: Nose normal.  Mouth/Throat: Oropharynx is clear and moist. No oropharyngeal exudate.  Eyes: Pupils are equal, round, and reactive to light. Conjunctivae and EOM are normal.  Neck: Normal range of motion. Neck supple. No JVD present. Carotid bruit is not present. No tracheal deviation present. No thyromegaly present.  Cardiovascular: Normal rate, regular rhythm and normal heart sounds. Exam reveals no gallop and no friction rub.  No murmur heard. Pulmonary/Chest: Effort normal and breath sounds normal. No respiratory distress. She has no wheezes. She has no rales. She exhibits no tenderness.  Abdominal: Soft. Bowel sounds are normal. There is no tenderness.  Musculoskeletal: Normal range of motion.  Lymphadenopathy:    She has no cervical adenopathy.  Neurological: She is alert and oriented to person, place, and time. No cranial nerve deficit.  Skin: Skin is warm and dry. She is not diaphoretic.  Psychiatric: She has a normal mood and affect. Her behavior is normal. Judgment and thought content normal.  Nursing note and vitals reviewed.  Assessment/Plan: 1. Hypothyroidism, unspecified type Restart levothyroxine at 84mcg every day. Recheck thyroid panel prior to next visit and adjust dosing as indicated.   2. Vitamin D deficiency - ergocalciferol (DRISDOL) 50000 units capsule; Take 1 capsule (50,000 Units total) by mouth once a week.  Dispense: 4 capsule; Refill: 5  3. Essential hypertension Stable. Continue blood pressure medications as prescribed. Refills provided today.  - carvedilol (COREG) 3.125 MG tablet; Take 1 tablet (3.125 mg total) by mouth 2 (two) times daily with a meal.  Dispense: 60 tablet; Refill: 5 - valsartan-hydrochlorothiazide (DIOVAN-HCT) 160-12.5 MG tablet; Take 1 tablet by mouth daily.  Dispense: 30 tablet; Refill: 2  4. Mixed hyperlipidemia Start crestor 5mg  tablets every evening. Will recheck fasting lipid panel prior to next visit and adjust  crestor dosing as indicated.  - rosuvastatin (CRESTOR) 5 MG tablet; Take 1 tablet (5 mg total) by mouth daily.  Dispense: 90 tablet; Refill: 3  5. Fever blister - acyclovir (ZOVIRAX) 400 MG tablet; Take 1 tablet (400 mg total) by mouth 5 (five) times daily.  Dispense: 15 tablet; Refill: 3  6. Vasomotor rhinitis - fluticasone (FLONASE) 50 MCG/ACT nasal spray; Place 2 sprays into both nostrils daily.  Dispense: 16 g; Refill: 5  General Counseling: Aija verbalizes understanding of the findings of todays visit and agrees with plan of treatment. I have discussed any further diagnostic evaluation that may be needed or ordered today. We also reviewed her medications today. she has been encouraged to call the office with any questions or concerns that should arise related to todays visit.  Hypertension Counseling:   The following hypertensive lifestyle modification were recommended and discussed:  1. Limiting alcohol intake to less than 1 oz/day of ethanol:(24 oz of beer or 8 oz of wine or 2 oz of 100-proof whiskey). 2. Take baby ASA 81 mg daily. 3. Importance of regular aerobic exercise and losing weight. 4. Reduce dietary saturated fat and cholesterol intake for overall cardiovascular health. 5. Maintaining adequate dietary potassium, calcium, and magnesium intake. 6. Regular monitoring of the blood pressure. 7. Reduce sodium intake to less than 100 mmol/day (less than 2.3 gm of sodium or less than 6 gm of sodium choride)   This patient was seen by [place] with Dr Lavera Guise as a part of collaborative care agreement  Meds ordered this encounter  Medications  . rosuvastatin (CRESTOR) 5 MG tablet    Sig: Take 1 tablet (5 mg total) by mouth daily.    Dispense:  90 tablet    Refill:  3    Order Specific Question:   Supervising Provider    Answer:   Lavera Guise [[id number]]  . fluticasone (FLONASE) 50 MCG/ACT nasal spray    Sig: Place 2 sprays into both nostrils daily.     Dispense:  16 g    Refill:  5    Order Specific Question:   Supervising Provider    Answer:   Lavera Guise [[id number]]  . acyclovir (ZOVIRAX) 400 MG tablet    Sig: Take 1 tablet (400 mg total) by mouth 5 (five) times daily.    Dispense:  15 tablet    Refill:  3    Order Specific Question:   Supervising Provider    Answer:   Lavera Guise [[id number]]  . carvedilol (COREG) 3.125 MG tablet    Sig: Take 1 tablet (3.125 mg total) by mouth 2 (two) times daily with a meal.    Dispense:  60 tablet    Refill:  5    Order Specific Question:   Supervising Provider    Answer:   Lavera Guise [[id number]]  . valsartan-hydrochlorothiazide (DIOVAN-HCT) 160-12.5 MG tablet    Sig: Take 1 tablet by mouth daily.    Dispense:  30 tablet    Refill:  2    Order Specific Question:   Supervising Provider    Answer:   Lavera Guise [[id number]]  . ergocalciferol (DRISDOL) 50000 units capsule    Sig: Take 1 capsule (50,000 Units total) by mouth once a week.    Dispense:  4 capsule    Refill:  5    Order Specific Question:   Supervising Provider    Answer:   Lavera Guise [[id number]]    Time spent: 55 Minutes      Dr Lavera Guise Internal medicine

## 2018-01-13 DIAGNOSIS — B001 Herpesviral vesicular dermatitis: Secondary | ICD-10-CM | POA: Insufficient documentation

## 2018-01-13 DIAGNOSIS — E559 Vitamin D deficiency, unspecified: Secondary | ICD-10-CM | POA: Insufficient documentation

## 2018-01-13 DIAGNOSIS — E782 Mixed hyperlipidemia: Secondary | ICD-10-CM | POA: Insufficient documentation

## 2018-01-13 DIAGNOSIS — J300 Vasomotor rhinitis: Secondary | ICD-10-CM | POA: Insufficient documentation

## 2018-03-01 ENCOUNTER — Other Ambulatory Visit: Payer: Self-pay | Admitting: Nurse Practitioner

## 2018-03-02 LAB — COMPREHENSIVE METABOLIC PANEL
A/G RATIO: 1.4 (ref 1.2–2.2)
ALT: 10 IU/L (ref 0–32)
AST: 16 IU/L (ref 0–40)
Albumin: 4.3 g/dL (ref 3.5–5.5)
Alkaline Phosphatase: 57 IU/L (ref 39–117)
BUN/Creatinine Ratio: 16 (ref 9–23)
BUN: 12 mg/dL (ref 6–24)
Bilirubin Total: 0.3 mg/dL (ref 0.0–1.2)
CALCIUM: 9.3 mg/dL (ref 8.7–10.2)
CHLORIDE: 100 mmol/L (ref 96–106)
CO2: 24 mmol/L (ref 20–29)
Creatinine, Ser: 0.75 mg/dL (ref 0.57–1.00)
GFR calc Af Amer: 110 mL/min/{1.73_m2} (ref >59)
GFR calc non Af Amer: 95 mL/min/{1.73_m2} (ref >59)
Globulin, Total: 3 g/dL (ref 1.5–4.5)
Glucose: 130 mg/dL — ABNORMAL HIGH (ref 65–99)
POTASSIUM: 3.8 mmol/L (ref 3.5–5.2)
Sodium: 137 mmol/L (ref 134–144)
Total Protein: 7.3 g/dL (ref 6.0–8.5)

## 2018-03-02 LAB — TSH: TSH: 6.89 u[IU]/mL — ABNORMAL HIGH (ref 0.450–4.500)

## 2018-03-02 LAB — LIPID PANEL W/O CHOL/HDL RATIO
CHOLESTEROL TOTAL: 161 mg/dL (ref 100–199)
HDL: 58 mg/dL (ref >39)
LDL Calculated: 83 mg/dL (ref 0–99)
TRIGLYCERIDES: 100 mg/dL (ref 0–149)
VLDL Cholesterol Cal: 20 mg/dL (ref 5–40)

## 2018-03-02 LAB — T3: T3, Total: 103 ng/dL (ref 71–180)

## 2018-03-02 LAB — T4, FREE: FREE T4: 1.34 ng/dL (ref 0.82–1.77)

## 2018-03-12 ENCOUNTER — Encounter: Payer: Self-pay | Admitting: Nurse Practitioner

## 2018-03-12 ENCOUNTER — Ambulatory Visit: Payer: 59 | Admitting: Nurse Practitioner

## 2018-03-12 VITALS — BP 118/68 | HR 93 | Resp 16 | Ht 66.0 in | Wt 135.2 lb

## 2018-03-12 DIAGNOSIS — I10 Essential (primary) hypertension: Secondary | ICD-10-CM

## 2018-03-12 DIAGNOSIS — Z1239 Encounter for other screening for malignant neoplasm of breast: Secondary | ICD-10-CM

## 2018-03-12 DIAGNOSIS — Z0001 Encounter for general adult medical examination with abnormal findings: Secondary | ICD-10-CM | POA: Insufficient documentation

## 2018-03-12 DIAGNOSIS — E782 Mixed hyperlipidemia: Secondary | ICD-10-CM

## 2018-03-12 DIAGNOSIS — E042 Nontoxic multinodular goiter: Secondary | ICD-10-CM | POA: Diagnosis not present

## 2018-03-12 DIAGNOSIS — E039 Hypothyroidism, unspecified: Secondary | ICD-10-CM

## 2018-03-12 MED ORDER — CARVEDILOL 12.5 MG PO TABS: 12.5000 mg | ORAL_TABLET | Freq: Two times a day (BID) | ORAL | 3 refills | Status: DC

## 2018-03-12 NOTE — Progress Notes (Signed)
[facility] [place], [place] [address]  Internal MEDICINE  Office Visit Note  Patient Name: Rebekah Mclean  [id number]  [id number]  Date of Service: 03/12/2018  Chief Complaint  Patient presents with  . Medical Management of Chronic Issues    2 month follow up   . Labs Only    review labs,    The patient is here for routine follow up exam. She had significant hypothyroid with TSH levels aboe 138 at her last visit. Restarted her levothyroxie back at 49mcg every day. She feels more energetic and less fatigued. Has no negative side effects to report. She does have multi nodular goiter which was followed by endocrinology, however, she does not wish to see them at this point in time .      Current Medication: Outpatient Encounter Medications as of 03/12/2018  Medication Sig  . acyclovir (ZOVIRAX) 400 MG tablet Take 1 tablet (400 mg total) by mouth 5 (five) times daily.  . carvedilol (COREG) 12.5 MG tablet Take 1 tablet (12.5 mg total) by mouth 2 (two) times daily with a meal.  . ergocalciferol (DRISDOL) 50000 units capsule Take 1 capsule (50,000 Units total) by mouth once a week.  . fluticasone (FLONASE) 50 MCG/ACT nasal spray Place 2 sprays into both nostrils daily.  Marland Kitchen ibuprofen (ADVIL,MOTRIN) 800 MG tablet Take 800 mg by mouth 3 (three) times daily as needed.  Marland Kitchen levothyroxine (SYNTHROID, LEVOTHROID) 75 MCG tablet Take 1 tablet (75 mcg total) by mouth daily before breakfast.  . rosuvastatin (CRESTOR) 5 MG tablet Take 1 tablet (5 mg total) by mouth daily.  . valsartan-hydrochlorothiazide (DIOVAN-HCT) 160-12.5 MG tablet Take 1 tablet by mouth daily.  . [DISCONTINUED] carvedilol (COREG) 3.125 MG tablet Take 1 tablet (3.125 mg total) by mouth 2 (two) times daily with a meal.   No facility-administered encounter medications on file as of 03/12/2018.     Surgical History: History reviewed. No pertinent surgical history.  Medical History: Past Medical  History:  Diagnosis Date  . GERD (gastroesophageal reflux disease)   . Hypertension   . Hypothyroidism   . Irregular menses     Family History: Family History  Problem Relation Age of Onset  . Diabetes Mother   . Hypertension Mother   . Diabetes Father   . Hypertension Father   . Breast cancer Neg Hx     Social History   Socioeconomic History  . Marital status: Married    Spouse name: Not on file  . Number of children: Not on file  . Years of education: Not on file  . Highest education level: Not on file  Occupational History  . Not on file  Social Needs  . Financial resource strain: Not on file  . Food insecurity:    Worry: Not on file    Inability: Not on file  . Transportation needs:    Medical: Not on file    Non-medical: Not on file  Tobacco Use  . Smoking status: Never Smoker  . Smokeless tobacco: Never Used  Substance and Sexual Activity  . Alcohol use: No  . Drug use: No  . Sexual activity: Yes    Birth control/protection: None  Lifestyle  . Physical activity:    Days per week: Not on file    Minutes per session: Not on file  . Stress: Not on file  Relationships  . Social connections:    Talks on phone: Not on file    Gets together: Not  on file    Attends religious service: Not on file    Active member of club or organization: Not on file    Attends meetings of clubs or organizations: Not on file    Relationship status: Not on file  . Intimate partner violence:    Fear of current or ex partner: Not on file    Emotionally abused: Not on file    Physically abused: Not on file    Forced sexual activity: Not on file  Other Topics Concern  . Not on file  Social History Narrative  . Not on file      Review of Systems  Constitutional: Negative for activity change, chills, fatigue and unexpected weight change.  HENT: Negative for congestion, postnasal drip, rhinorrhea, sneezing and sore throat.   Eyes: Negative.  Negative for redness.   Respiratory: Negative for cough, chest tightness, shortness of breath and wheezing.   Cardiovascular: Negative for chest pain and palpitations.  Gastrointestinal: Negative for abdominal pain, constipation, diarrhea, nausea and vomiting.  Endocrine: Negative for cold intolerance, heat intolerance, polydipsia, polyphagia and polyuria.       Thyroid panel improved significantly   Musculoskeletal: Negative for arthralgias, back pain, joint swelling and neck pain.  Skin: Negative for rash.  Allergic/Immunologic: Negative for environmental allergies.  Neurological: Negative for dizziness, tremors, numbness and headaches.  Hematological: Negative for adenopathy. Does not bruise/bleed easily.  Psychiatric/Behavioral: Negative for behavioral problems (Depression), sleep disturbance and suicidal ideas. The patient is not nervous/anxious.     Vital Signs: Today's Vitals   03/12/18 0852  BP: 118/68  Pulse: 93  Resp: 16  SpO2: 100%  Weight: 135 lb 3.2 oz (61.3 kg)  Height: 5\' 6"  (1.676 m)    Physical Exam  Constitutional: She is oriented to person, place, and time. She appears well-developed and well-nourished. No distress.  HENT:  Head: Normocephalic and atraumatic.  Mouth/Throat: No oropharyngeal exudate.  Eyes: Pupils are equal, round, and reactive to light. EOM are normal.  Neck: Normal range of motion. Neck supple. No JVD present. Carotid bruit is not present. No tracheal deviation present. Thyromegaly present.  Cardiovascular: Normal rate, regular rhythm and normal heart sounds. Exam reveals no gallop and no friction rub.  No murmur heard. Pulmonary/Chest: Effort normal and breath sounds normal. No respiratory distress. She has no wheezes. She has no rales. She exhibits no tenderness.  Abdominal: Soft. Bowel sounds are normal. There is no tenderness.  Musculoskeletal: Normal range of motion.  Lymphadenopathy:    She has no cervical adenopathy.  Neurological: She is alert and oriented  to person, place, and time. No cranial nerve deficit.  Skin: Skin is warm and dry. She is not diaphoretic.  Psychiatric: She has a normal mood and affect. Her behavior is normal. Judgment and thought content normal.  Nursing note and vitals reviewed.  Assessment/Plan: 1. Hypothyroidism, unspecified type TSH still slightly elevated, however, T3 and Free T4 are normal. Will continue levothyroxine at current dose.   2. Multinodular goiter Get u/s thyroid for further evaluation. Will discuss at next visit.  - US Soft Tissue Head/Neck; Future  3. Essential hypertension Stable. Continue bp medication as prescribed.  - carvedilol (COREG) 12.5 MG tablet; Take 1 tablet (12.5 mg total) by mouth 2 (two) times daily with a meal.  Dispense: 60 tablet; Refill: 3  4. Mixed hyperlipidemia Check fasting lipid panel prior to next visit and adjust crestor dosing as indicated.   5. Screening for breast cancer - MM DIGITAL SCREENING  BILATERAL; Future  General Counseling: Letonya verbalizes understanding of the findings of todays visit and agrees with plan of treatment. I have discussed any further diagnostic evaluation that may be needed or ordered today. We also reviewed her medications today. she has been encouraged to call the office with any questions or concerns that should arise related to todays visit.  Hypertension Counseling:   The following hypertensive lifestyle modification were recommended and discussed:  1. Limiting alcohol intake to less than 1 oz/day of ethanol:(24 oz of beer or 8 oz of wine or 2 oz of 100-proof whiskey). 2. Take baby ASA 81 mg daily. 3. Importance of regular aerobic exercise and losing weight. 4. Reduce dietary saturated fat and cholesterol intake for overall cardiovascular health. 5. Maintaining adequate dietary potassium, calcium, and magnesium intake. 6. Regular monitoring of the blood pressure. 7. Reduce sodium intake to less than 100 mmol/day (less than 2.3 gm of  sodium or less than 6 gm of sodium choride)   This patient was seen by [place] with Dr Lavera Guise as a part of collaborative care agreement  Orders Placed This Encounter  Procedures  . MM DIGITAL SCREENING BILATERAL  . US Soft Tissue Head/Neck    Meds ordered this encounter  Medications  . carvedilol (COREG) 12.5 MG tablet    Sig: Take 1 tablet (12.5 mg total) by mouth 2 (two) times daily with a meal.    Dispense:  60 tablet    Refill:  3    Please note that patient should be on 12.5mg  dose twice daily. Thanks    Order Specific Question:   Supervising Provider    Answer:   Lavera Guise [[id number]]    Time spent: 39 Minutes      Dr Lavera Guise Internal medicine

## 2018-04-05 ENCOUNTER — Other Ambulatory Visit: Payer: Self-pay

## 2018-04-12 ENCOUNTER — Ambulatory Visit (INDEPENDENT_AMBULATORY_CARE_PROVIDER_SITE_OTHER): Payer: 59

## 2018-04-12 ENCOUNTER — Other Ambulatory Visit: Payer: Self-pay

## 2018-04-12 DIAGNOSIS — E039 Hypothyroidism, unspecified: Secondary | ICD-10-CM | POA: Diagnosis not present

## 2018-04-12 DIAGNOSIS — E042 Nontoxic multinodular goiter: Secondary | ICD-10-CM | POA: Diagnosis not present

## 2018-04-23 ENCOUNTER — Ambulatory Visit
Admission: RE | Admit: 2018-04-23 | Discharge: 2018-04-23 | Disposition: A | Discharge status: Home / Self Care | Payer: 59 | Source: Ambulatory Visit | Attending: Nurse Practitioner | Admitting: Nurse Practitioner

## 2018-04-23 DIAGNOSIS — Z1239 Encounter for other screening for malignant neoplasm of breast: Secondary | ICD-10-CM | POA: Diagnosis not present

## 2018-04-23 DIAGNOSIS — Z1231 Encounter for screening mammogram for malignant neoplasm of breast: Secondary | ICD-10-CM | POA: Diagnosis not present

## 2018-04-29 ENCOUNTER — Other Ambulatory Visit: Payer: Self-pay

## 2018-04-29 DIAGNOSIS — I10 Essential (primary) hypertension: Secondary | ICD-10-CM

## 2018-04-29 MED ORDER — VALSARTAN-HYDROCHLOROTHIAZIDE 160-12.5 MG PO TABS: 1.0000 | ORAL_TABLET | Freq: Every day | ORAL | 2 refills | Status: DC

## 2018-06-14 ENCOUNTER — Ambulatory Visit: Payer: Self-pay | Admitting: Nurse Practitioner

## 2018-06-18 ENCOUNTER — Other Ambulatory Visit: Payer: Self-pay

## 2018-06-18 DIAGNOSIS — E039 Hypothyroidism, unspecified: Secondary | ICD-10-CM

## 2018-06-18 MED ORDER — LEVOTHYROXINE SODIUM 75 MCG PO TABS: 75.0000 ug | ORAL_TABLET | Freq: Every day | ORAL | 1 refills | Status: DC

## 2018-06-21 ENCOUNTER — Ambulatory Visit: Payer: 59 | Admitting: Nurse Practitioner

## 2018-06-21 ENCOUNTER — Encounter: Payer: Self-pay | Admitting: Nurse Practitioner

## 2018-06-21 ENCOUNTER — Other Ambulatory Visit: Payer: Self-pay | Admitting: Nurse Practitioner

## 2018-06-21 VITALS — BP 118/80 | HR 81 | Resp 16 | Ht 66.0 in | Wt 136.0 lb

## 2018-06-21 DIAGNOSIS — I10 Essential (primary) hypertension: Secondary | ICD-10-CM

## 2018-06-21 DIAGNOSIS — E039 Hypothyroidism, unspecified: Secondary | ICD-10-CM | POA: Diagnosis not present

## 2018-06-21 DIAGNOSIS — E559 Vitamin D deficiency, unspecified: Secondary | ICD-10-CM | POA: Diagnosis not present

## 2018-06-21 DIAGNOSIS — E042 Nontoxic multinodular goiter: Secondary | ICD-10-CM

## 2018-06-21 MED ORDER — ERGOCALCIFEROL 1.25 MG (50000 UT) PO CAPS: 50000.0000 [IU] | ORAL_CAPSULE | ORAL | 5 refills | Status: DC

## 2018-06-21 NOTE — Progress Notes (Signed)
[facility] [place], [place] [address]  Internal MEDICINE  Office Visit Note  Patient Name: Rebekah Mclean  [id number]  [id number]  Date of Service: 07/07/2018  Chief Complaint  Patient presents with  . Hypertension  . Hypothyroidism    The patient is here for routine follow up exam. She had significant hypothyroid with TSH levels aboe 138 at her last visit. Restarted her levothyroxie back at 30mcg every day. She feels more energetic and less fatigued. Has no negative side effects to report. She does have multi nodular goiter which was followed by endocrinology, however, she does not wish to see them at this point in time . She had updated thyroid ultrasound completed since her last visit. Results are stable with small, hyperechoic thyroid with small thyroid nodules. Her thyroid panel is now within normal limits. Blood pressure remains well controlled.       Current Medication: Outpatient Encounter Medications as of 06/21/2018  Medication Sig  . acyclovir (ZOVIRAX) 400 MG tablet Take 1 tablet (400 mg total) by mouth 5 (five) times daily.  . carvedilol (COREG) 12.5 MG tablet Take 1 tablet (12.5 mg total) by mouth 2 (two) times daily with a meal.  . fluticasone (FLONASE) 50 MCG/ACT nasal spray Place 2 sprays into both nostrils daily.  Marland Kitchen ibuprofen (ADVIL,MOTRIN) 800 MG tablet Take 800 mg by mouth 3 (three) times daily as needed.  Marland Kitchen levothyroxine (SYNTHROID, LEVOTHROID) 75 MCG tablet Take 1 tablet (75 mcg total) by mouth daily before breakfast.  . rosuvastatin (CRESTOR) 5 MG tablet Take 1 tablet (5 mg total) by mouth daily.  . valsartan-hydrochlorothiazide (DIOVAN-HCT) 160-12.5 MG tablet Take 1 tablet by mouth daily.  . ergocalciferol (DRISDOL) 1.25 MG (50000 UT) capsule Take 1 capsule (50,000 Units total) by mouth once a week.  . [DISCONTINUED] ergocalciferol (DRISDOL) 50000 units capsule Take 1 capsule (50,000 Units total) by mouth once a week. (Patient not  taking: Reported on 06/21/2018)   No facility-administered encounter medications on file as of 06/21/2018.     Surgical History: History reviewed. No pertinent surgical history.  Medical History: Past Medical History:  Diagnosis Date  . GERD (gastroesophageal reflux disease)   . Hypertension   . Hypothyroidism   . Irregular menses     Family History: Family History  Problem Relation Age of Onset  . Diabetes Mother   . Hypertension Mother   . Diabetes Father   . Hypertension Father   . Breast cancer Neg Hx     Social History   Socioeconomic History  . Marital status: Married    Spouse name: Not on file  . Number of children: Not on file  . Years of education: Not on file  . Highest education level: Not on file  Occupational History  . Not on file  Social Needs  . Financial resource strain: Not on file  . Food insecurity:    Worry: Not on file    Inability: Not on file  . Transportation needs:    Medical: Not on file    Non-medical: Not on file  Tobacco Use  . Smoking status: Never Smoker  . Smokeless tobacco: Never Used  Substance and Sexual Activity  . Alcohol use: No  . Drug use: No  . Sexual activity: Yes    Birth control/protection: None  Lifestyle  . Physical activity:    Days per week: Not on file    Minutes per session: Not on file  . Stress: Not on file  Relationships  . Social connections:    Talks on phone: Not on file    Gets together: Not on file    Attends religious service: Not on file    Active member of club or organization: Not on file    Attends meetings of clubs or organizations: Not on file    Relationship status: Not on file  . Intimate partner violence:    Fear of current or ex partner: Not on file    Emotionally abused: Not on file    Physically abused: Not on file    Forced sexual activity: Not on file  Other Topics Concern  . Not on file  Social History Narrative  . Not on file      Review of Systems   Constitutional: Negative for activity change, chills, fatigue and unexpected weight change.  HENT: Negative for congestion, postnasal drip, rhinorrhea, sneezing and sore throat.   Respiratory: Negative for cough, chest tightness, shortness of breath and wheezing.   Cardiovascular: Negative for chest pain and palpitations.  Gastrointestinal: Negative for abdominal pain, constipation, diarrhea, nausea and vomiting.  Endocrine: Negative for cold intolerance, heat intolerance, polydipsia and polyuria.       Most recent thyroid panel is normal.   Musculoskeletal: Negative for arthralgias, back pain, joint swelling and neck pain.  Skin: Negative for rash.  Allergic/Immunologic: Negative for environmental allergies.  Neurological: Negative for dizziness, tremors, numbness and headaches.  Hematological: Negative for adenopathy. Does not bruise/bleed easily.  Psychiatric/Behavioral: Negative for behavioral problems (Depression), sleep disturbance and suicidal ideas. The patient is not nervous/anxious.     Vital Signs: BP 118/80   Pulse 81   Resp 16   Ht 5\' 6"  (1.676 m)   Wt 136 lb (61.7 kg)   SpO2 100%   BMI 21.95 kg/m    Physical Exam Vitals signs and nursing note reviewed.  Constitutional:      General: She is not in acute distress.    Appearance: Normal appearance. She is well-developed. She is not diaphoretic.  HENT:     Head: Normocephalic and atraumatic.     Mouth/Throat:     Pharynx: No oropharyngeal exudate.  Eyes:     Conjunctiva/sclera: Conjunctivae normal.     Pupils: Pupils are equal, round, and reactive to light.  Neck:     Musculoskeletal: Normal range of motion and neck supple.     Thyroid: Thyromegaly present.     Vascular: No carotid bruit or JVD.     Trachea: No tracheal deviation.     Comments: Nodular thyroid present. Cardiovascular:     Rate and Rhythm: Normal rate and regular rhythm.     Heart sounds: Normal heart sounds. No murmur. No friction rub. No  gallop.   Pulmonary:     Effort: Pulmonary effort is normal. No respiratory distress.     Breath sounds: Normal breath sounds. No wheezing or rales.  Chest:     Chest wall: No tenderness.  Abdominal:     General: Bowel sounds are normal.     Palpations: Abdomen is soft.     Tenderness: There is no abdominal tenderness.  Musculoskeletal: Normal range of motion.  Lymphadenopathy:     Cervical: No cervical adenopathy.  Skin:    General: Skin is warm and dry.  Neurological:     Mental Status: She is alert and oriented to person, place, and time.     Cranial Nerves: No cranial nerve deficit.  Psychiatric:  Behavior: Behavior normal.        Thought Content: Thought content normal.        Judgment: Judgment normal.   Assessment/Plan: 1. Hypothyroidism, unspecified type Most recent thyroid panel is normal. Continue levothyroxine at 33mcg daily.   2. Multinodular goiter Reviewed recent thyroid ultrasound which remains stable. Will repeat in one uear for continued monitoring.   3. Essential hypertension Stable. Continue blood pressure medication as prescribed   4. Vitamin D deficiency - ergocalciferol (DRISDOL) 1.25 MG (50000 UT) capsule; Take 1 capsule (50,000 Units total) by mouth once a week.  Dispense: 4 capsule; Refill: 5  General Counseling: Gerardine verbalizes understanding of the findings of todays visit and agrees with plan of treatment. I have discussed any further diagnostic evaluation that may be needed or ordered today. We also reviewed her medications today. she has been encouraged to call the office with any questions or concerns that should arise related to todays visit.  Hypertension Counseling:   The following hypertensive lifestyle modification were recommended and discussed:  1. Limiting alcohol intake to less than 1 oz/day of ethanol:(24 oz of beer or 8 oz of wine or 2 oz of 100-proof whiskey). 2. Take baby ASA 81 mg daily. 3. Importance of regular aerobic  exercise and losing weight. 4. Reduce dietary saturated fat and cholesterol intake for overall cardiovascular health. 5. Maintaining adequate dietary potassium, calcium, and magnesium intake. 6. Regular monitoring of the blood pressure. 7. Reduce sodium intake to less than 100 mmol/day (less than 2.3 gm of sodium or less than 6 gm of sodium choride)   This patient was seen by [place] with Dr Lavera Guise as a part of collaborative care agreement  Meds ordered this encounter  Medications  . ergocalciferol (DRISDOL) 1.25 MG (50000 UT) capsule    Sig: Take 1 capsule (50,000 Units total) by mouth once a week.    Dispense:  4 capsule    Refill:  5    Order Specific Question:   Supervising Provider    Answer:   Lavera Guise [[id number]]    Time spent: 38 Minutes      Dr Lavera Guise Internal medicine

## 2018-06-22 LAB — T3: T3 TOTAL: 98 ng/dL (ref 71–180)

## 2018-06-22 LAB — T4, FREE: Free T4: 1.64 ng/dL (ref 0.82–1.77)

## 2018-06-22 LAB — TSH: TSH: 3.04 u[IU]/mL (ref 0.450–4.500)

## 2018-07-22 ENCOUNTER — Other Ambulatory Visit: Payer: Self-pay | Admitting: Nurse Practitioner

## 2018-07-22 DIAGNOSIS — I10 Essential (primary) hypertension: Secondary | ICD-10-CM

## 2018-07-22 MED ORDER — VALSARTAN-HYDROCHLOROTHIAZIDE 160-12.5 MG PO TABS: 1.0000 | ORAL_TABLET | Freq: Every day | ORAL | 2 refills | Status: DC

## 2018-08-07 ENCOUNTER — Encounter: Payer: Self-pay | Admitting: Nurse Practitioner

## 2018-08-07 ENCOUNTER — Ambulatory Visit: Payer: BLUE CROSS/BLUE SHIELD | Admitting: Nurse Practitioner

## 2018-08-07 ENCOUNTER — Other Ambulatory Visit: Payer: Self-pay

## 2018-08-07 VITALS — BP 131/89 | HR 92 | Temp 97.5°F | Resp 16 | Ht 66.0 in | Wt 136.2 lb

## 2018-08-07 DIAGNOSIS — L2089 Other atopic dermatitis: Secondary | ICD-10-CM

## 2018-08-07 DIAGNOSIS — L03111 Cellulitis of right axilla: Secondary | ICD-10-CM | POA: Diagnosis not present

## 2018-08-07 MED ORDER — TRIAMCINOLONE ACETONIDE 0.025 % EX CREA: 1.0000 "application " | TOPICAL_CREAM | Freq: Two times a day (BID) | CUTANEOUS | 2 refills | Status: DC

## 2018-08-07 MED ORDER — CEPHALEXIN 500 MG PO CAPS: 500.0000 mg | ORAL_CAPSULE | Freq: Three times a day (TID) | ORAL | 0 refills | Status: DC

## 2018-08-07 NOTE — Progress Notes (Signed)
[facility] [place], [place] [address]  Internal MEDICINE  Office Visit Note  Patient Name: Rebekah Mclean  [id number]  [id number]  Date of Service: 08/21/2018   Pt is here for a sick visit.   Chief Complaint  Patient presents with  . Rash    pt used gel deoderant and it irritated the skin it started getting better but pt noticed a bump underneath the right arm, pt mentioned that it didnt hurt      The patient has noted lump/sore under the right arm. Started after she used a new, gel Deoderant. This really irritated the skin under her arm. She has been putting vaseline on the are to try to soothe it, but feels like there is swelling under the right arm. This has been present for about two weeks. thsi is not getting worse, but not really getting better. Denies fever, headache, or chills.       Current Medication:  Outpatient Encounter Medications as of 08/07/2018  Medication Sig  . acyclovir (ZOVIRAX) 400 MG tablet Take 1 tablet (400 mg total) by mouth 5 (five) times daily.  . carvedilol (COREG) 12.5 MG tablet Take 1 tablet (12.5 mg total) by mouth 2 (two) times daily with a meal.  . ergocalciferol (DRISDOL) 1.25 MG (50000 UT) capsule Take 1 capsule (50,000 Units total) by mouth once a week.  . fluticasone (FLONASE) 50 MCG/ACT nasal spray Place 2 sprays into both nostrils daily.  Marland Kitchen ibuprofen (ADVIL,MOTRIN) 800 MG tablet Take 800 mg by mouth 3 (three) times daily as needed.  Marland Kitchen levothyroxine (SYNTHROID, LEVOTHROID) 75 MCG tablet Take 1 tablet (75 mcg total) by mouth daily before breakfast.  . rosuvastatin (CRESTOR) 5 MG tablet Take 1 tablet (5 mg total) by mouth daily.  . valsartan-hydrochlorothiazide (DIOVAN-HCT) 160-12.5 MG tablet Take 1 tablet by mouth daily.  . cephALEXin (KEFLEX) 500 MG capsule Take 1 capsule (500 mg total) by mouth 3 (three) times daily.  Marland Kitchen triamcinolone (KENALOG) 0.025 % cream Apply 1 application topically 2 (two) times daily.    No facility-administered encounter medications on file as of 08/07/2018.       Medical History: Past Medical History:  Diagnosis Date  . GERD (gastroesophageal reflux disease)   . Hypertension   . Hypothyroidism   . Irregular menses      Today's Vitals   08/07/18 0947  BP: 131/89  Pulse: 92  Resp: 16  Temp: (!) 97.5 F (36.4 C)  SpO2: 100%  Weight: 136 lb 3.2 oz (61.8 kg)  Height: 5\' 6"  (1.676 m)   Body mass index is 21.98 kg/m.  Review of Systems  Constitutional: Negative for activity change, chills, fatigue and unexpected weight change.  HENT: Negative for congestion, postnasal drip, rhinorrhea, sneezing and sore throat.   Respiratory: Negative for cough, chest tightness, shortness of breath and wheezing.   Cardiovascular: Negative for chest pain and palpitations.  Gastrointestinal: Negative for abdominal pain, constipation, diarrhea, nausea and vomiting.  Skin: Positive for rash.       Tender and inflamed rash under the arms. Worse under the right arm than the left. Can feel lumps under the arm.   Allergic/Immunologic: Negative for environmental allergies.  Neurological: Negative for dizziness, tremors, numbness and headaches.  Hematological: Positive for adenopathy.  Psychiatric/Behavioral: Behavioral problem: Depression.    Physical Exam Vitals signs and nursing note reviewed.  Constitutional:      Appearance: Normal appearance.  HENT:     Head: Normocephalic and atraumatic.  Eyes:     Extraocular Movements: Extraocular movements intact.     Pupils: Pupils are equal, round, and reactive to light.  Neck:     Musculoskeletal: Normal range of motion.  Cardiovascular:     Rate and Rhythm: Normal rate and regular rhythm.     Heart sounds: Normal heart sounds.  Pulmonary:     Breath sounds: Normal breath sounds.  Skin:    Comments: Very red and inflamed rash in bilateral axillary regions. Warm and tender to palpate. Lymph nodes are enlarged under both  arms. Skin is intact and there is no drainage present.   Neurological:     Mental Status: She is alert and oriented to person, place, and time.  Psychiatric:        Mood and Affect: Mood normal.   Assessment/Plan: 1. Cellulitis of right axilla Start cephalexin 500mg  tid for 7 days. Recommend keeping the axillary regions clean and dry and avoiding Deoderant for next few days . - cephALEXin (KEFLEX) 500 MG capsule; Take 1 capsule (500 mg total) by mouth 3 (three) times daily.  Dispense: 21 capsule; Refill: 0  2. Other atopic dermatitis Add triamcinolone cream. May be applied to affected areas bid as needed  - triamcinolone (KENALOG) 0.025 % cream; Apply 1 application topically 2 (two) times daily.  Dispense: 80 g; Refill: 2  General Counseling: Akari verbalizes understanding of the findings of todays visit and agrees with plan of treatment. I have discussed any further diagnostic evaluation that may be needed or ordered today. We also reviewed her medications today. she has been encouraged to call the office with any questions or concerns that should arise related to todays visit.    Counseling:  This patient was seen by [place] with Dr Lavera Guise as a part of collaborative care agreement  Meds ordered this encounter  Medications  . cephALEXin (KEFLEX) 500 MG capsule    Sig: Take 1 capsule (500 mg total) by mouth 3 (three) times daily.    Dispense:  21 capsule    Refill:  0    Order Specific Question:   Supervising Provider    Answer:   Lavera Guise [[id number]]  . triamcinolone (KENALOG) 0.025 % cream    Sig: Apply 1 application topically 2 (two) times daily.    Dispense:  80 g    Refill:  2    Order Specific Question:   Supervising Provider    Answer:   Lavera Guise [[id number]]    Time spent: 15 Minutes

## 2018-08-21 DIAGNOSIS — L2089 Other atopic dermatitis: Secondary | ICD-10-CM | POA: Insufficient documentation

## 2018-08-21 DIAGNOSIS — L03111 Cellulitis of right axilla: Secondary | ICD-10-CM | POA: Insufficient documentation

## 2018-08-26 ENCOUNTER — Telehealth: Payer: Self-pay

## 2018-08-27 NOTE — Telephone Encounter (Signed)
Can you find out how long this has been going on for? Her cholesterol was so much better when we rechecked in November. Since she has been on it for some time, i'm not sure this is causing stomach pain. Anything different in diet or stress levels?

## 2018-10-23 ENCOUNTER — Other Ambulatory Visit: Payer: Self-pay

## 2018-10-23 DIAGNOSIS — I10 Essential (primary) hypertension: Secondary | ICD-10-CM

## 2018-10-23 MED ORDER — VALSARTAN-HYDROCHLOROTHIAZIDE 160-12.5 MG PO TABS: 1.0000 | ORAL_TABLET | Freq: Every day | ORAL | 2 refills | Status: DC

## 2018-10-24 DIAGNOSIS — I8393 Asymptomatic varicose veins of bilateral lower extremities: Secondary | ICD-10-CM | POA: Diagnosis not present

## 2018-10-28 ENCOUNTER — Other Ambulatory Visit: Payer: Self-pay

## 2018-10-28 DIAGNOSIS — I10 Essential (primary) hypertension: Secondary | ICD-10-CM

## 2018-10-28 MED ORDER — VALSARTAN-HYDROCHLOROTHIAZIDE 160-12.5 MG PO TABS: 1.0000 | ORAL_TABLET | Freq: Every day | ORAL | 2 refills | Status: DC

## 2018-12-20 ENCOUNTER — Ambulatory Visit (INDEPENDENT_AMBULATORY_CARE_PROVIDER_SITE_OTHER): Payer: BC Managed Care – PPO | Admitting: Nurse Practitioner

## 2018-12-20 ENCOUNTER — Encounter: Payer: Self-pay | Admitting: Nurse Practitioner

## 2018-12-20 VITALS — BP 119/78 | HR 95 | Resp 16 | Ht 66.0 in | Wt 136.0 lb

## 2018-12-20 DIAGNOSIS — I10 Essential (primary) hypertension: Secondary | ICD-10-CM

## 2018-12-20 DIAGNOSIS — Z0001 Encounter for general adult medical examination with abnormal findings: Secondary | ICD-10-CM | POA: Diagnosis not present

## 2018-12-20 DIAGNOSIS — R300 Dysuria: Secondary | ICD-10-CM | POA: Diagnosis not present

## 2018-12-20 DIAGNOSIS — E039 Hypothyroidism, unspecified: Secondary | ICD-10-CM

## 2018-12-20 NOTE — Progress Notes (Signed)
[facility] [place], [place] [address]  Internal MEDICINE  Office Visit Note  Patient Name: Rebekah Mclean  [id number]  [id number]  Date of Service: 12/20/2018   Pt is here for routine health maintenance examination   Chief Complaint  Patient presents with  . Annual Exam  . Hypothyroidism  . Hyperlipidemia  . Hypertension     The patient is here for health maintenance exam. She will be due for routine, fasting labs in November and screening mammogram in December. She is feeling well with no concerns or complaints. Blood pressure is well controlled.    Current Medication: Outpatient Encounter Medications as of 12/20/2018  Medication Sig  . acyclovir (ZOVIRAX) 400 MG tablet Take 1 tablet (400 mg total) by mouth 5 (five) times daily.  . carvedilol (COREG) 12.5 MG tablet Take 1 tablet (12.5 mg total) by mouth 2 (two) times daily with a meal.  . ergocalciferol (DRISDOL) 1.25 MG (50000 UT) capsule Take 1 capsule (50,000 Units total) by mouth once a week.  . fluticasone (FLONASE) 50 MCG/ACT nasal spray Place 2 sprays into both nostrils daily.  Marland Kitchen ibuprofen (ADVIL,MOTRIN) 800 MG tablet Take 800 mg by mouth 3 (three) times daily as needed.  Marland Kitchen levothyroxine (SYNTHROID, LEVOTHROID) 75 MCG tablet Take 1 tablet (75 mcg total) by mouth daily before breakfast.  . rosuvastatin (CRESTOR) 5 MG tablet Take 1 tablet (5 mg total) by mouth daily.  . valsartan-hydrochlorothiazide (DIOVAN-HCT) 160-12.5 MG tablet Take 1 tablet by mouth daily.  . [DISCONTINUED] cephALEXin (KEFLEX) 500 MG capsule Take 1 capsule (500 mg total) by mouth 3 (three) times daily. (Patient not taking: Reported on 12/20/2018)  . [DISCONTINUED] triamcinolone (KENALOG) 0.025 % cream Apply 1 application topically 2 (two) times daily. (Patient not taking: Reported on 12/20/2018)   No facility-administered encounter medications on file as of 12/20/2018.     Surgical History: History reviewed. No pertinent  surgical history.  Medical History: Past Medical History:  Diagnosis Date  . GERD (gastroesophageal reflux disease)   . Hypertension   . Hypothyroidism   . Irregular menses     Family History: Family History  Problem Relation Age of Onset  . Diabetes Mother   . Hypertension Mother   . Diabetes Father   . Hypertension Father   . Breast cancer Neg Hx       Review of Systems  Constitutional: Negative for activity change, chills, fatigue and unexpected weight change.  HENT: Negative for congestion, postnasal drip, rhinorrhea, sneezing and sore throat.   Respiratory: Negative for cough, chest tightness, shortness of breath and wheezing.   Cardiovascular: Negative for chest pain and palpitations.  Gastrointestinal: Negative for abdominal pain, constipation, diarrhea, nausea and vomiting.  Endocrine: Negative for cold intolerance, heat intolerance, polydipsia and polyuria.       Most recent thyroid panel is normal.   Musculoskeletal: Negative for arthralgias, back pain, joint swelling and neck pain.  Skin: Negative for rash.  Allergic/Immunologic: Negative for environmental allergies.  Neurological: Negative for dizziness, tremors, numbness and headaches.  Hematological: Negative for adenopathy. Does not bruise/bleed easily.  Psychiatric/Behavioral: Negative for behavioral problems (Depression), sleep disturbance and suicidal ideas. The patient is not nervous/anxious.      Today's Vitals   12/20/18 0913  BP: 119/78  Pulse: 95  Resp: 16  SpO2: 100%  Weight: 136 lb (61.7 kg)  Height: 5\' 6"  (1.676 m)   Body mass index is 21.95 kg/m.  Physical Exam Vitals signs and nursing note reviewed.  Constitutional:      General: She is not in acute distress.    Appearance: Normal appearance. She is well-developed. She is not diaphoretic.  HENT:     Head: Normocephalic and atraumatic.     Mouth/Throat:     Pharynx: No oropharyngeal exudate.  Eyes:     Conjunctiva/sclera:  Conjunctivae normal.     Pupils: Pupils are equal, round, and reactive to light.  Neck:     Musculoskeletal: Normal range of motion and neck supple.     Thyroid: Thyromegaly present.     Vascular: No carotid bruit or JVD.     Trachea: No tracheal deviation.     Comments: Nodular thyroid present. Cardiovascular:     Rate and Rhythm: Normal rate and regular rhythm.     Pulses: Normal pulses.     Heart sounds: Normal heart sounds. No murmur. No friction rub. No gallop.   Pulmonary:     Effort: Pulmonary effort is normal. No respiratory distress.     Breath sounds: Normal breath sounds. No wheezing or rales.  Chest:     Chest wall: No tenderness.     Breasts:        Right: Normal. No swelling, bleeding, inverted nipple, mass, nipple discharge, skin change or tenderness.        Left: No swelling, bleeding, inverted nipple, mass, nipple discharge, skin change or tenderness.  Abdominal:     General: Bowel sounds are normal.     Palpations: Abdomen is soft.     Tenderness: There is no abdominal tenderness.  Musculoskeletal: Normal range of motion.  Lymphadenopathy:     Cervical: No cervical adenopathy.  Skin:    General: Skin is warm and dry.  Neurological:     Mental Status: She is alert and oriented to person, place, and time.     Cranial Nerves: No cranial nerve deficit.  Psychiatric:        Behavior: Behavior normal.        Thought Content: Thought content normal.        Judgment: Judgment normal.   Assessment/Plan: 1. Encounter for general adult medical examination with abnormal findings Annual health maintenance exam today.  2. Essential hypertension Stable. Continue bp medication as prescribed.   3. Acquired hypothyroidism Most thyroid panel normal. Will monitor cloesly.   4. Dysuria - Urinalysis, Routine w reflex microscopic  General Counseling: Athalee verbalizes understanding of the findings of todays visit and agrees with plan of treatment. I have discussed any  further diagnostic evaluation that may be needed or ordered today. We also reviewed her medications today. she has been encouraged to call the office with any questions or concerns that should arise related to todays visit.    Counseling:  Hypertension Counseling:   The following hypertensive lifestyle modification were recommended and discussed:  1. Limiting alcohol intake to less than 1 oz/day of ethanol:(24 oz of beer or 8 oz of wine or 2 oz of 100-proof whiskey). 2. Take baby ASA 81 mg daily. 3. Importance of regular aerobic exercise and losing weight. 4. Reduce dietary saturated fat and cholesterol intake for overall cardiovascular health. 5. Maintaining adequate dietary potassium, calcium, and magnesium intake. 6. Regular monitoring of the blood pressure. 7. Reduce sodium intake to less than 100 mmol/day (less than 2.3 gm of sodium or less than 6 gm of sodium choride)   This patient was seen by [place] with Dr Lavera Guise as a part of collaborative care agreement  Orders Placed This Encounter  Procedures  . Urinalysis, Routine w reflex microscopic     Time spent: [place], MD  Internal Medicine

## 2018-12-21 LAB — URINALYSIS, ROUTINE W REFLEX MICROSCOPIC
Bilirubin, UA: NEGATIVE
Glucose, UA: NEGATIVE
Ketones, UA: NEGATIVE
Leukocytes,UA: NEGATIVE
Nitrite, UA: NEGATIVE
Specific Gravity, UA: 1.016 (ref 1.005–1.030)
Urobilinogen, Ur: 0.2 mg/dL (ref 0.2–1.0)
pH, UA: 6 (ref 5.0–7.5)

## 2018-12-21 LAB — MICROSCOPIC EXAMINATION
Casts: NONE SEEN /lpf
Epithelial Cells (non renal): NONE SEEN /hpf (ref 0–10)
RBC, Urine: >30 /hpf — AB (ref 0–2)

## 2018-12-22 NOTE — Progress Notes (Signed)
Can you find out if patient was having menstrual cycle during her visit? There was noted to be blood in her urine. Thanks.

## 2018-12-23 NOTE — Progress Notes (Signed)
Ok. Very good. Thank you.

## 2018-12-25 ENCOUNTER — Other Ambulatory Visit: Payer: Self-pay

## 2018-12-25 DIAGNOSIS — E039 Hypothyroidism, unspecified: Secondary | ICD-10-CM

## 2018-12-25 MED ORDER — LEVOTHYROXINE SODIUM 75 MCG PO TABS: 75.0000 ug | ORAL_TABLET | Freq: Every day | ORAL | 1 refills | Status: DC

## 2019-02-10 ENCOUNTER — Other Ambulatory Visit: Payer: Self-pay

## 2019-02-10 DIAGNOSIS — B001 Herpesviral vesicular dermatitis: Secondary | ICD-10-CM

## 2019-02-10 MED ORDER — ACYCLOVIR 400 MG PO TABS: 400.0000 mg | ORAL_TABLET | Freq: Every day | ORAL | 3 refills | Status: DC

## 2019-02-11 ENCOUNTER — Other Ambulatory Visit: Payer: Self-pay | Admitting: Nurse Practitioner

## 2019-02-11 DIAGNOSIS — B001 Herpesviral vesicular dermatitis: Secondary | ICD-10-CM

## 2019-02-11 MED ORDER — ACYCLOVIR 400 MG PO TABS: 400.0000 mg | ORAL_TABLET | Freq: Every day | ORAL | 3 refills | Status: DC

## 2019-03-12 ENCOUNTER — Other Ambulatory Visit: Payer: Self-pay | Admitting: Nurse Practitioner

## 2019-03-12 DIAGNOSIS — Z1231 Encounter for screening mammogram for malignant neoplasm of breast: Secondary | ICD-10-CM

## 2019-03-12 DIAGNOSIS — E559 Vitamin D deficiency, unspecified: Secondary | ICD-10-CM | POA: Diagnosis not present

## 2019-03-12 DIAGNOSIS — E039 Hypothyroidism, unspecified: Secondary | ICD-10-CM | POA: Diagnosis not present

## 2019-03-12 DIAGNOSIS — I10 Essential (primary) hypertension: Secondary | ICD-10-CM | POA: Diagnosis not present

## 2019-03-12 DIAGNOSIS — Z0001 Encounter for general adult medical examination with abnormal findings: Secondary | ICD-10-CM | POA: Diagnosis not present

## 2019-03-13 LAB — CBC
Hematocrit: 40.4 % (ref 34.0–46.6)
Hemoglobin: 13.3 g/dL (ref 11.1–15.9)
MCH: 27 pg (ref 26.6–33.0)
MCHC: 32.9 g/dL (ref 31.5–35.7)
MCV: 82 fL (ref 79–97)
Platelets: 325 10*3/uL (ref 150–450)
RBC: 4.93 x10E6/uL (ref 3.77–5.28)
RDW: 13 % (ref 11.7–15.4)
WBC: 10.9 10*3/uL — ABNORMAL HIGH (ref 3.4–10.8)

## 2019-03-13 LAB — COMPREHENSIVE METABOLIC PANEL
ALT: 12 IU/L (ref 0–32)
AST: 17 IU/L (ref 0–40)
Albumin/Globulin Ratio: 1.5 (ref 1.2–2.2)
Albumin: 4.4 g/dL (ref 3.8–4.8)
Alkaline Phosphatase: 53 IU/L (ref 39–117)
BUN/Creatinine Ratio: 13 (ref 9–23)
BUN: 10 mg/dL (ref 6–24)
Bilirubin Total: 0.5 mg/dL (ref 0.0–1.2)
CO2: 23 mmol/L (ref 20–29)
Calcium: 9.8 mg/dL (ref 8.7–10.2)
Chloride: 101 mmol/L (ref 96–106)
Creatinine, Ser: 0.8 mg/dL (ref 0.57–1.00)
GFR calc Af Amer: 101 mL/min/{1.73_m2} (ref >59)
GFR calc non Af Amer: 87 mL/min/{1.73_m2} (ref >59)
Globulin, Total: 2.9 g/dL (ref 1.5–4.5)
Glucose: 96 mg/dL (ref 65–99)
Potassium: 4 mmol/L (ref 3.5–5.2)
Sodium: 138 mmol/L (ref 134–144)
Total Protein: 7.3 g/dL (ref 6.0–8.5)

## 2019-03-13 LAB — LIPID PANEL W/O CHOL/HDL RATIO
Cholesterol, Total: 185 mg/dL (ref 100–199)
HDL: 73 mg/dL (ref >39)
LDL Chol Calc (NIH): 92 mg/dL (ref 0–99)
Triglycerides: 115 mg/dL (ref 0–149)
VLDL Cholesterol Cal: 20 mg/dL (ref 5–40)

## 2019-03-13 LAB — TSH: TSH: 8.93 u[IU]/mL — ABNORMAL HIGH (ref 0.450–4.500)

## 2019-03-13 LAB — T4, FREE: Free T4: 1.43 ng/dL (ref 0.82–1.77)

## 2019-03-13 LAB — VITAMIN D 25 HYDROXY (VIT D DEFICIENCY, FRACTURES): Vit D, 25-Hydroxy: 32.7 ng/mL (ref 30.0–100.0)

## 2019-03-26 ENCOUNTER — Other Ambulatory Visit: Payer: Self-pay

## 2019-03-26 DIAGNOSIS — E782 Mixed hyperlipidemia: Secondary | ICD-10-CM

## 2019-03-26 MED ORDER — ROSUVASTATIN CALCIUM 5 MG PO TABS: 5.0000 mg | ORAL_TABLET | Freq: Every day | ORAL | 1 refills | Status: DC

## 2019-03-27 NOTE — Progress Notes (Signed)
Will increase synthroid and order repeat ultrasound of thyroid at visit 04/22/2019

## 2019-04-17 ENCOUNTER — Telehealth: Payer: Self-pay

## 2019-04-17 NOTE — Telephone Encounter (Signed)
PATIENT CONFIRMED AND SCREENED FOR 04-22-19 OV.

## 2019-04-17 NOTE — Telephone Encounter (Signed)
Called lmom informing patient of appointment. klh 

## 2019-04-22 ENCOUNTER — Encounter: Payer: Self-pay | Admitting: Nurse Practitioner

## 2019-04-22 ENCOUNTER — Ambulatory Visit: Payer: BC Managed Care – PPO | Admitting: Nurse Practitioner

## 2019-04-22 ENCOUNTER — Other Ambulatory Visit: Payer: Self-pay

## 2019-04-22 VITALS — BP 135/91 | HR 93 | Resp 16 | Ht 66.0 in | Wt 136.6 lb

## 2019-04-22 DIAGNOSIS — D72829 Elevated white blood cell count, unspecified: Secondary | ICD-10-CM

## 2019-04-22 DIAGNOSIS — I10 Essential (primary) hypertension: Secondary | ICD-10-CM | POA: Diagnosis not present

## 2019-04-22 DIAGNOSIS — E039 Hypothyroidism, unspecified: Secondary | ICD-10-CM | POA: Diagnosis not present

## 2019-04-22 MED ORDER — LEVOTHYROXINE SODIUM 88 MCG PO TABS: 88.0000 ug | ORAL_TABLET | Freq: Every day | ORAL | 3 refills | Status: DC

## 2019-04-22 NOTE — Progress Notes (Signed)
[facility] [place], [place] [address]  Internal MEDICINE  Office Visit Note  Patient Name: Rebekah Mclean  [id number]  [id number]  Date of Service: 04/27/2019  Chief Complaint  Patient presents with  . Hypertension  . Hypothyroidism  . Gastroesophageal Reflux    [name] presents to clinic for follow-up on routine labs.her TSH is 8.9 with normal Free T4. Her WBC was 10.9. she denies fever, chills, or other evidence of infection.   She denies any symptoms or issues at this time.  Her blood pressure is well controlled.       Current Medication: Outpatient Encounter Medications as of 04/22/2019  Medication Sig  . acyclovir (ZOVIRAX) 400 MG tablet Take 1 tablet (400 mg total) by mouth 5 (five) times daily.  . carvedilol (COREG) 12.5 MG tablet Take 1 tablet (12.5 mg total) by mouth 2 (two) times daily with a meal.  . ergocalciferol (DRISDOL) 1.25 MG (50000 UT) capsule Take 1 capsule (50,000 Units total) by mouth once a week.  . fluticasone (FLONASE) 50 MCG/ACT nasal spray Place 2 sprays into both nostrils daily.  Marland Kitchen ibuprofen (ADVIL,MOTRIN) 800 MG tablet Take 800 mg by mouth 3 (three) times daily as needed.  Marland Kitchen levothyroxine (SYNTHROID) 88 MCG tablet Take 1 tablet (88 mcg total) by mouth daily before breakfast.  . rosuvastatin (CRESTOR) 5 MG tablet Take 1 tablet (5 mg total) by mouth daily.  . valsartan-hydrochlorothiazide (DIOVAN-HCT) 160-12.5 MG tablet Take 1 tablet by mouth daily.  . [DISCONTINUED] levothyroxine (SYNTHROID) 75 MCG tablet Take 1 tablet (75 mcg total) by mouth daily before breakfast.   No facility-administered encounter medications on file as of 04/22/2019.    Surgical History: History reviewed. No pertinent surgical history.  Medical History: Past Medical History:  Diagnosis Date  . GERD (gastroesophageal reflux disease)   . Hypertension   . Hypothyroidism   . Irregular menses     Family History: Family History  Problem  Relation Age of Onset  . Diabetes Mother   . Hypertension Mother   . Diabetes Father   . Hypertension Father   . Breast cancer Neg Hx     Social History   Socioeconomic History  . Marital status: Married    Spouse name: Not on file  . Number of children: Not on file  . Years of education: Not on file  . Highest education level: Not on file  Occupational History  . Not on file  Tobacco Use  . Smoking status: Never Smoker  . Smokeless tobacco: Never Used  Substance and Sexual Activity  . Alcohol use: No  . Drug use: No  . Sexual activity: Yes    Birth control/protection: None  Other Topics Concern  . Not on file  Social History Narrative  . Not on file   Social Determinants of Health   Financial Resource Strain:   . Difficulty of Paying Living Expenses: Not on file  Food Insecurity:   . Worried About Charity fundraiser in the Last Year: Not on file  . Ran Out of Food in the Last Year: Not on file  Transportation Needs:   . Lack of Transportation (Medical): Not on file  . Lack of Transportation (Non-Medical): Not on file  Physical Activity:   . Days of Exercise per Week: Not on file  . Minutes of Exercise per Session: Not on file  Stress:   . Feeling of Stress : Not on file  Social Connections:   .  Frequency of Communication with Friends and Family: Not on file  . Frequency of Social Gatherings with Friends and Family: Not on file  . Attends Religious Services: Not on file  . Active Member of Clubs or Organizations: Not on file  . Attends Archivist Meetings: Not on file  . Marital Status: Not on file  Intimate Partner Violence:   . Fear of Current or Ex-Partner: Not on file  . Emotionally Abused: Not on file  . Physically Abused: Not on file  . Sexually Abused: Not on file      Review of Systems  Constitutional: Negative for appetite change, chills, fatigue, fever and unexpected weight change.  HENT: Negative for congestion, ear pain, postnasal  drip, rhinorrhea, sinus pressure, sinus pain, sneezing, sore throat and tinnitus.   Respiratory: Negative for cough, chest tightness, shortness of breath and wheezing.   Cardiovascular: Negative for chest pain and palpitations.  Gastrointestinal: Negative for abdominal pain, constipation, diarrhea, nausea and vomiting.  Endocrine: Negative for cold intolerance, polydipsia and polyuria.  Musculoskeletal: Negative for arthralgias, back pain, joint swelling and neck pain.  Skin: Negative for rash and wound.  Allergic/Immunologic: Negative for environmental allergies.  Neurological: Negative for tremors, light-headedness, numbness and headaches.  Hematological: Negative for adenopathy. Does not bruise/bleed easily.  Psychiatric/Behavioral: Negative for behavioral problems (Depression), dysphoric mood, sleep disturbance and suicidal ideas. The patient is not nervous/anxious.     Today's Vitals   04/22/19 1413  BP: (!) 135/91  Pulse: 93  Resp: 16  SpO2: 100%  Weight: 136 lb 9.6 oz (62 kg)  Height: 5\' 6"  (1.676 m)   Body mass index is 22.05 kg/m.  Physical Exam Vitals and nursing note reviewed.  Constitutional:      Appearance: Normal appearance. She is normal weight.  HENT:     Head: Normocephalic and atraumatic.     Nose: Nose normal.  Eyes:     Extraocular Movements: Extraocular movements intact.     Pupils: Pupils are equal, round, and reactive to light.  Cardiovascular:     Rate and Rhythm: Normal rate and regular rhythm.     Heart sounds: Normal heart sounds.  Pulmonary:     Effort: Pulmonary effort is normal.     Breath sounds: Normal breath sounds.  Abdominal:     Palpations: Abdomen is soft.  Musculoskeletal:        General: Normal range of motion.     Cervical back: Normal range of motion and neck supple.  Skin:    General: Skin is warm and dry.  Neurological:     Mental Status: She is alert and oriented to person, place, and time.  Psychiatric:        Mood and  Affect: Mood normal.        Behavior: Behavior normal.        Thought Content: Thought content normal.        Judgment: Judgment normal.    Assessment/Plan: 1. Essential hypertension Stable. Continue bp medication as prescribed   2. Hypothyroidism, unspecified type TSH 8.9 with normal free T4. Increase levothyroxine to 30mcg daily. Retest in 8 to 12 weeks and adjust as indicated.  - levothyroxine (SYNTHROID) 88 MCG tablet; Take 1 tablet (88 mcg total) by mouth daily before breakfast.  Dispense: 30 tablet; Refill: 3  3. Leukocytosis, unspecified type WBC 10.9. patient denies any evidence of infection. Will retest and treat as indicated.   General Counseling: Juanelle verbalizes understanding of the findings of todays visit  and agrees with plan of treatment. I have discussed any further diagnostic evaluation that may be needed or ordered today. We also reviewed her medications today. she has been encouraged to call the office with any questions or concerns that should arise related to todays visit.   This patient was seen by Betsy Layne with Dr Lavera Guise as a part of collaborative care agreement  Meds ordered this encounter  Medications  . levothyroxine (SYNTHROID) 88 MCG tablet    Sig: Take 1 tablet (88 mcg total) by mouth daily before breakfast.    Dispense:  30 tablet    Refill:  3    Order Specific Question:   Supervising Provider    Answer:   Lavera Guise [id number]    Time spent: 37 Minutes      Dr Lavera Guise Internal medicine

## 2019-04-27 DIAGNOSIS — D72829 Elevated white blood cell count, unspecified: Secondary | ICD-10-CM | POA: Insufficient documentation

## 2019-04-30 ENCOUNTER — Other Ambulatory Visit: Payer: Self-pay

## 2019-04-30 DIAGNOSIS — I10 Essential (primary) hypertension: Secondary | ICD-10-CM

## 2019-04-30 MED ORDER — VALSARTAN-HYDROCHLOROTHIAZIDE 160-12.5 MG PO TABS: 1.0000 | ORAL_TABLET | Freq: Every day | ORAL | 3 refills | Status: DC

## 2019-05-02 ENCOUNTER — Other Ambulatory Visit: Payer: Self-pay

## 2019-05-02 DIAGNOSIS — I10 Essential (primary) hypertension: Secondary | ICD-10-CM

## 2019-05-02 MED ORDER — VALSARTAN-HYDROCHLOROTHIAZIDE 160-12.5 MG PO TABS: 1.0000 | ORAL_TABLET | Freq: Every day | ORAL | 3 refills | Status: DC

## 2019-05-05 ENCOUNTER — Telehealth: Payer: Self-pay

## 2019-05-05 NOTE — Telephone Encounter (Signed)
Patient called c/o fibroid issues, I advised patient to call obgyn and if she has any issues getting advice or an appointment to give [place] a call back. Beth

## 2019-05-06 ENCOUNTER — Other Ambulatory Visit: Payer: Self-pay

## 2019-05-06 ENCOUNTER — Encounter: Payer: Self-pay | Admitting: Obstetrics and Gynecology

## 2019-05-06 ENCOUNTER — Ambulatory Visit
Admission: RE | Admit: 2019-05-06 | Discharge: 2019-05-06 | Disposition: A | Discharge status: Home / Self Care | Payer: BC Managed Care – PPO | Source: Ambulatory Visit | Attending: Nurse Practitioner | Admitting: Nurse Practitioner

## 2019-05-06 ENCOUNTER — Ambulatory Visit (INDEPENDENT_AMBULATORY_CARE_PROVIDER_SITE_OTHER): Payer: BC Managed Care – PPO | Admitting: Obstetrics and Gynecology

## 2019-05-06 ENCOUNTER — Other Ambulatory Visit (HOSPITAL_COMMUNITY)
Admission: RE | Admit: 2019-05-06 | Discharge: 2019-05-06 | Disposition: A | Discharge status: Home / Self Care | Payer: BC Managed Care – PPO | Source: Ambulatory Visit | Attending: Obstetrics and Gynecology | Admitting: Obstetrics and Gynecology

## 2019-05-06 VITALS — BP 124/76 | HR 99 | Ht 66.0 in | Wt 138.8 lb

## 2019-05-06 DIAGNOSIS — N938 Other specified abnormal uterine and vaginal bleeding: Secondary | ICD-10-CM

## 2019-05-06 DIAGNOSIS — Z124 Encounter for screening for malignant neoplasm of cervix: Secondary | ICD-10-CM

## 2019-05-06 DIAGNOSIS — Z1231 Encounter for screening mammogram for malignant neoplasm of breast: Secondary | ICD-10-CM | POA: Insufficient documentation

## 2019-05-06 DIAGNOSIS — D252 Subserosal leiomyoma of uterus: Secondary | ICD-10-CM | POA: Diagnosis not present

## 2019-05-06 DIAGNOSIS — R5383 Other fatigue: Secondary | ICD-10-CM

## 2019-05-06 NOTE — Progress Notes (Signed)
Pt present for fibroid issues. Pt stated having prolonged cycles lasting 2 weeks, lower back pain, decrease in energy level and changing pads/tampons every hour. Pt's LMP 04/20/19 and pt is still on her cycle today.

## 2019-05-06 NOTE — Progress Notes (Signed)
GYNECOLOGY PROGRESS NOTE  Subjective:    Patient ID: Rebekah Mclean, female    DOB: Oct 30, 1970, 49 y.o.   MRN: [id number]  HPI  Patient is a 49 y.o. G15P1001 female who presents for complaints of pelvic and back pain/dysmenorrhea associated with her cycle.  Notes that her most recent cycle has lasted for 3 weeks and heavier than normal (was changing almost hourly). Also experiencing some fatigue. Notes passage of small clots. This is the first time that this has happened. Patient's last menstrual period was 04/20/2019. Bleeding has begun to slow down this week. Cycles usually only last ~ 5 days, usually light to moderate flow. She does have a history of fibroid uterus, has not bothered her in the past.    Unknown when last pap smear was, feels like it was several years ago.   Of note, she does have a history of thyroid disease. Has recently had a dose change in her thyroid medication. Not due to be seen again by her PCP until March. Also sees an Musician.   The following portions of the patient's history were reviewed and updated as appropriate: allergies, current medications, past family history, past medical history, past social history, past surgical history and problem list.  Review of Systems Pertinent items noted in HPI and remainder of comprehensive ROS otherwise negative.   Objective:   Blood pressure 124/76, pulse 99, height 5\' 6"  (1.676 m), weight 138 lb 12.8 oz (63 kg), last menstrual period 04/20/2019. General appearance: alert and no distress Abdomen: soft, non-tender; bowel sounds normal; no masses,  no organomegaly Pelvic: external genitalia normal, rectovaginal septum normal.  Vagina with scant dark red blood, no discharge.  Cervix normal appearing, no lesions and no motion tenderness.  Uterus mobile, nontender, slightly enlarged ~ 10-12 cm.  Adnexae non-palpable, nontender bilaterally.  Extremities: extremities normal, atraumatic, no cyanosis or edema Neurologic:  Grossly normal   Labs:   Lab Results  Component Value Date   TSH 8.930 (H) 03/12/2019   Lab Results  Component Value Date   WBC 10.9 (H) 03/12/2019   HGB 13.3 03/12/2019   HCT 40.4 03/12/2019   MCV 82 03/12/2019   PLT 325 03/12/2019     Imaging:  Pelvic Ultrasound Imaging (03/02/2016):   Report: Uterus does not appear enlarged. There is a 4 cm subserous fibroid in left side of the uterus.  Endometrial thickness appears to be normal, 2.6 millimeters. Ovaries are unremarkable.  No pelvic masses or free fluid identified.    Assessment:   1. DUB (dysfunctional uterine bleeding)   2. Subserous leiomyoma of uterus   3. Fatigue, unspecified type   4. Pap smear for cervical cancer screening     Plan:   1. Patient has dysfunctional uterine bleeding with a prior history of fibroid uterus. Review of chart shows that patient was seen once before in 2018 for similar issue, follwed by resumption of normal cycles. She has a slightly enlarged uterus on today's exam, fibroid not palpable.  Will order abnormal uterine bleeding evaluation labs and pelvic ultrasound to evaluate for enlargement of her fibroids or any other structural gynecologic abnormalities. RTC in 1 week.  Will also perform endometrial biopsy at that time.  2. Fatigue - unclear if due to thyroid issues, recent bout of abnormal bleeding, or other cause.  Has just undergone adjustment of her thyroid meds,to soon to recheck levels at this time. Will also recheck CBC and a Vitamin D level.  3. Pap smear performed  today, patient cannot recall when last pap smear was, but thinks it was several years ago.     [name], MD [facility]

## 2019-05-06 NOTE — Patient Instructions (Signed)
Abnormal Uterine Bleeding °Abnormal uterine bleeding is unusual bleeding from the uterus. It includes: °· Bleeding or spotting between periods. °· Bleeding after sex. °· Bleeding that is heavier than normal. °· Periods that last longer than usual. °· Bleeding after menopause. °Abnormal uterine bleeding can affect women at various stages in life, including teenagers, women in their reproductive years, pregnant women, and women who have reached menopause. Common causes of abnormal uterine bleeding include: °· Pregnancy. °· Growths of tissue (polyps). °· A noncancerous tumor in the uterus (fibroid). °· Infection. °· Cancer. °· Hormonal imbalances. °Any type of abnormal bleeding should be evaluated by a health care provider. Many cases are minor and simple to treat, while others are more serious. Treatment will depend on the cause of the bleeding. °Follow these instructions at home: °· Monitor your condition for any changes. °· Do not use tampons, douche, or have sex if told by your health care provider. °· Change your pads often. °· Get regular exams that include pelvic exams and cervical cancer screening. °· Keep all follow-up visits as told by your health care provider. This is important. °Contact a health care provider if: °· Your bleeding lasts for more than one week. °· You feel dizzy at times. °· You feel nauseous or you vomit. °Get help right away if: °· You pass out. °· Your bleeding soaks through a pad every hour. °· You have abdominal pain. °· You have a fever. °· You become sweaty or weak. °· You pass large blood clots from your vagina. °Summary °· Abnormal uterine bleeding is unusual bleeding from the uterus. °· Any type of abnormal bleeding should be evaluated by a health care provider. Many cases are minor and simple to treat, while others are more serious. °· Treatment will depend on the cause of the bleeding. °This information is not intended to replace advice given to you by your health care provider.  Make sure you discuss any questions you have with your health care provider. °Document Revised: 07/18/2017 Document Reviewed: 05/12/2016 °Elsevier Patient Education © 2020 [employer] Inc. ° °

## 2019-05-07 LAB — TRANSFERRIN: Transferrin: 320 mg/dL (ref 192–364)

## 2019-05-07 LAB — IRON,TIBC AND FERRITIN PANEL
Ferritin: 39 ng/mL (ref 15–150)
Iron Saturation: 28 % (ref 15–55)
Iron: 103 ug/dL (ref 27–159)
Total Iron Binding Capacity: 374 ug/dL (ref 250–450)
UIBC: 271 ug/dL (ref 131–425)

## 2019-05-07 LAB — FSH/LH
FSH: 3.9 m[IU]/mL
LH: 17.8 m[IU]/mL

## 2019-05-07 LAB — CBC
Hematocrit: 34.1 % (ref 34.0–46.6)
Hemoglobin: 11.3 g/dL (ref 11.1–15.9)
MCH: 27.2 pg (ref 26.6–33.0)
MCHC: 33.1 g/dL (ref 31.5–35.7)
MCV: 82 fL (ref 79–97)
Platelets: 274 10*3/uL (ref 150–450)
RBC: 4.15 x10E6/uL (ref 3.77–5.28)
RDW: 12.3 % (ref 11.7–15.4)
WBC: 12.2 10*3/uL — ABNORMAL HIGH (ref 3.4–10.8)

## 2019-05-07 LAB — PROGESTERONE: Progesterone: 0.6 ng/mL

## 2019-05-07 LAB — VITAMIN D 25 HYDROXY (VIT D DEFICIENCY, FRACTURES): Vit D, 25-Hydroxy: 24 ng/mL — ABNORMAL LOW (ref 30.0–100.0)

## 2019-05-07 LAB — ESTRADIOL: Estradiol: 700 pg/mL

## 2019-05-07 NOTE — Progress Notes (Signed)
Negative mammogram

## 2019-05-12 LAB — CYTOLOGY - PAP
Comment: NEGATIVE
Diagnosis: UNDETERMINED — AB
High risk HPV: NEGATIVE

## 2019-05-15 ENCOUNTER — Other Ambulatory Visit: Payer: Self-pay

## 2019-05-15 ENCOUNTER — Encounter: Payer: Self-pay | Admitting: Obstetrics and Gynecology

## 2019-05-15 ENCOUNTER — Ambulatory Visit (INDEPENDENT_AMBULATORY_CARE_PROVIDER_SITE_OTHER): Payer: BC Managed Care – PPO | Admitting: Obstetrics and Gynecology

## 2019-05-15 ENCOUNTER — Other Ambulatory Visit (HOSPITAL_COMMUNITY)
Admission: RE | Admit: 2019-05-15 | Discharge: 2019-05-15 | Disposition: A | Discharge status: Home / Self Care | Payer: BC Managed Care – PPO | Source: Ambulatory Visit | Attending: Obstetrics and Gynecology | Admitting: Obstetrics and Gynecology

## 2019-05-15 ENCOUNTER — Ambulatory Visit: Payer: BC Managed Care – PPO | Admitting: Obstetrics and Gynecology

## 2019-05-15 ENCOUNTER — Ambulatory Visit (INDEPENDENT_AMBULATORY_CARE_PROVIDER_SITE_OTHER): Payer: BC Managed Care – PPO

## 2019-05-15 VITALS — BP 123/89 | HR 106 | Ht 66.0 in | Wt 137.0 lb

## 2019-05-15 DIAGNOSIS — N83202 Unspecified ovarian cyst, left side: Secondary | ICD-10-CM

## 2019-05-15 DIAGNOSIS — N938 Other specified abnormal uterine and vaginal bleeding: Secondary | ICD-10-CM

## 2019-05-15 DIAGNOSIS — D251 Intramural leiomyoma of uterus: Secondary | ICD-10-CM

## 2019-05-15 DIAGNOSIS — D252 Subserosal leiomyoma of uterus: Secondary | ICD-10-CM | POA: Diagnosis not present

## 2019-05-15 MED ORDER — ORIAHNN 300-1-0.5 & 300 MG PO CPPK: 1.0000 | ORAL_CAPSULE | Freq: Two times a day (BID) | ORAL | 11 refills | Status: DC

## 2019-05-15 NOTE — Progress Notes (Signed)
GYNECOLOGY PROGRESS NOTE  Subjective:    Patient ID: Rebekah Mclean, female    DOB: [birth year], 49 y.o.   MRN: [id number]  HPI  Patient is a 49 y.o. G78P1001 female who presents for follow up of ultrasound results and labs.  Patient previously seen last week for complaints of abnormal uterine bleeding, fatigue.  She has a past history of fibroid uterus and thyroid disease. She denies complaints today. Notes that her bleeding finally stopped last week (around 05/08/19) after bleeding for over 3 weeks.     The following portions of the patient's history were reviewed and updated as appropriate: allergies, current medications, past family history, past medical history, past social history, past surgical history and problem list.  Review of Systems Pertinent items noted in HPI and remainder of comprehensive ROS otherwise negative.   Objective:   Blood pressure 123/89, pulse (!) 106, height 5\' 6"  (1.676 m), weight 137 lb (62.1 kg), last menstrual period 04/20/2019. General appearance: alert and no distress Abdomen: soft, non-tender; bowel sounds normal; no masses,  no organomegaly Pelvic: external genitalia normal, rectovaginal septum normal.  Vagina without discharge.  Cervix normal appearing, no lesions and no motion tenderness.  Uterus mobile, nontender, slightly enlarged ~ 10-12 cm.  Adnexae non-palpable, nontender bilaterally.     Labs:  Results for orders placed or performed in visit on 05/06/19  FSH/LH  Result Value Ref Range   LH 17.8 mIU/mL   FSH 3.9 mIU/mL  Estradiol  Result Value Ref Range   Estradiol 700.0 pg/mL  Progesterone  Result Value Ref Range   Progesterone 0.6 ng/mL  CBC  Result Value Ref Range   WBC 12.2 (H) 3.4 - 10.8 x10E3/uL   RBC 4.15 3.77 - 5.28 x10E6/uL   Hemoglobin 11.3 11.1 - 15.9 g/dL   Hematocrit 34.1 34.0 - 46.6 %   MCV 82 79 - 97 fL   MCH 27.2 26.6 - 33.0 pg   MCHC 33.1 31.5 - 35.7 g/dL   RDW 12.3 11.7 - 15.4 %   Platelets 274 150 - 450  x10E3/uL  Transferrin  Result Value Ref Range   Transferrin 320 192 - 364 mg/dL  Iron, TIBC and Ferritin Panel  Result Value Ref Range   Total Iron Binding Capacity 374 250 - 450 ug/dL   UIBC 271 131 - 425 ug/dL   Iron 103 27 - 159 ug/dL   Iron Saturation 28 15 - 55 %   Ferritin 39 15 - 150 ng/mL  Vitamin D (25 hydroxy)  Result Value Ref Range   Vit D, 25-Hydroxy 24.0 (L) 30.0 - 100.0 ng/mL  Cytology - PAP  Result Value Ref Range   High risk HPV Negative    Adequacy      Satisfactory for evaluation; transformation zone component PRESENT.   Diagnosis (A)     - Atypical squamous cells of undetermined significance (ASC-US)   Comment Normal Reference Range HPV - Negative     Imaging:  Patient Name: Rebekah Mclean DOB: 1970-06-01 MRN: [id number] ULTRASOUND REPORT  Location: [facility]  Date of Service: 05/15/2019     Indications:AUB Findings:  The uterus is anteverted and measures 11.3 x 7.7 x 8.1 cm. Echo texture is heterogenous with evidence of focal masses. Within the uterus are multiple suspected fibroids measuring: Fibroid 1:Posterior IM ( NEW) 4.3 x 4.3 x 4.8 cm Fibroid 2:Fundal SS (OLD) 2.9 x 2.7 x 2.9 cm  The Endometrium measures 7 mm.  Right Ovary measures 2.8 x  2.1 x 2.3 cm. It is normal in appearance. Left Ovary measures 5.3 x 3.1 x 3.9 cm. It is normal in appearance. Hypoechoic lesion with smooth walls and good through transmission measuring 4.3 x 2.2 x 3.0 cm.  Survey of the adnexa demonstrates no adnexal masses. There is no free fluid in the cul de sac.  Impression: 1. Enlarge uterus with multiple fibroids as described above.  2. Lt ovarian simple cyst.  Recommendations: 1.Clinical correlation with the patient's History and Physical Exam.   Jenine M. Alessi    RDMS    Pelvic Ultrasound Imaging - External (03/02/2016):  Report:Uterus does not appear enlarged. There is a 4 cm subserous fibroid in left side of the uterus.  Endometrial thickness appears to be normal, 2.6 millimeters. Ovaries are unremarkable. No pelvic masses or free fluid identified.   Assessment:   Abnormal uterine bleeding Fibroid uterus  Plan:   1. Reviwed ultrasound and lab findings with patient.  2. Discussed management options for abnormal uterine bleeding including NSAIDs (Naproxen), tranexamic acid (Lysteda), oral progesterone, Depo Provera, Levonorgestral IUD, Oriahnn or hysterectomy as definitive surgical management.  Discussed risks and benefits of each method.   Patient desires to think about options but will likely go with [facility].  Printed patient education handouts were given to the patient to review at home.  Given sample of Slynd for now to start until decision made, to make next cycle lighter.  3. Endometrial biopsy performed today to complete workup of abnormal uterine bleeding.  4. Once patient decides on option, can arrange appropriate follow up.   Endometrial Biopsy Procedure Note  The patient is positioned on the exam table in the dorsal lithotomy position. Bimanual exam confirms uterine position and size. A Graves speculum is placed into the vagina. A single toothed tenaculum is placed onto the anterior lip of the cervix. The pipette is placed into the endocervical canal and is advanced to the uterine fundus. Using a piston like technique, with vacuum created by withdrawing the stylus, the endometrial specimen is obtained and transferred to the biopsy container. Minimal bleeding is encountered. The procedure is well tolerated.   Uterine Position: mid    Uterine Length: 10 cm   Uterine Specimen:  Lush  Post procedure instructions are given. Will notify patient by Mychart/phone of results.     A total of 15 minutes were spent face-to-face with the patient during this encounter and over half of that time dealt with counseling and coordination of care.  [name], MD [facility]

## 2019-05-15 NOTE — Progress Notes (Signed)
Pt present for ultrasound results. Pt state that she was doing well no problems.

## 2019-05-15 NOTE — Patient Instructions (Signed)

## 2019-05-19 LAB — SURGICAL PATHOLOGY

## 2019-05-21 ENCOUNTER — Telehealth: Payer: Self-pay

## 2019-05-22 NOTE — Telephone Encounter (Signed)
PT WAS NOTIFIED. 

## 2019-05-22 NOTE — Telephone Encounter (Signed)
Yes. This is progesterone only. Will be good with her blood pressure medications.

## 2019-06-21 ENCOUNTER — Ambulatory Visit: Payer: BC Managed Care – PPO | Attending: Internal Medicine

## 2019-06-21 DIAGNOSIS — Z23 Encounter for immunization: Secondary | ICD-10-CM | POA: Insufficient documentation

## 2019-06-21 NOTE — Progress Notes (Signed)
   Covid-19 Vaccination Clinic  Name:  Rebekah Mclean    MRN: [id number] DOB: Apr 21, 1971  06/21/2019  Rebekah Mclean was observed post Covid-19 immunization for 15 minutes without incidence. She was provided with Vaccine Information Sheet and instruction to access the V-Safe system.   Rebekah Mclean was instructed to call 911 with any severe reactions post vaccine: Marland Kitchen Difficulty breathing  . Swelling of your face and throat  . A fast heartbeat  . A bad rash all over your body  . Dizziness and weakness    Immunizations Administered    Name Date Dose VIS Date Route   Moderna COVID-19 Vaccine 06/21/2019 10:21 AM 0.5 mL 03/25/2019 Intramuscular   Manufacturer: Moderna   Lot: [id number]   [id number]

## 2019-06-30 ENCOUNTER — Telehealth: Payer: Self-pay

## 2019-06-30 NOTE — Telephone Encounter (Signed)
Pt called stating that after receiving the covid vaccine on 06/21/19,  that Sunday after her bp top number went up to 150 and bottom number ran 89/90 and for the next two weeks the top number ran 140-150, but today the morning bp was 133/89. Pt states when she lays down the bp goes to normal range and when she gets back up the bp gets elevated. Pt was concerned if this can happen due to the vaccine.   Spoke with Nira Conn and advised pt that as far as heather knows the vaccine does not elevate bp. Asked pt per Nira Conn if she takes bp prior to taking the medication and pt states the readings are prior to taking bp meds. Per Nira Conn advised pt that because the readings are prior to taking medication it is normal for bp readings to be elevated prior to medication. Pt states she check bp a few min before calling her and bp reading was 117/68.  Advised pt if bp readings stay elevated then to give [place] a call back.

## 2019-07-03 ENCOUNTER — Other Ambulatory Visit: Payer: Self-pay | Admitting: Nurse Practitioner

## 2019-07-04 LAB — CBC
Hematocrit: 37.8 % (ref 34.0–46.6)
Hemoglobin: 12.6 g/dL (ref 11.1–15.9)
MCH: 26.2 pg — ABNORMAL LOW (ref 26.6–33.0)
MCHC: 33.3 g/dL (ref 31.5–35.7)
MCV: 79 fL (ref 79–97)
Platelets: 283 10*3/uL (ref 150–450)
RBC: 4.81 x10E6/uL (ref 3.77–5.28)
RDW: 11.7 % (ref 11.7–15.4)
WBC: 7.5 10*3/uL (ref 3.4–10.8)

## 2019-07-04 LAB — TSH: TSH: 2.59 u[IU]/mL (ref 0.450–4.500)

## 2019-07-04 LAB — T4, FREE: Free T4: 1.67 ng/dL (ref 0.82–1.77)

## 2019-07-09 NOTE — Progress Notes (Signed)
Labs improved. Will discuss at visit 07/21/2019

## 2019-07-16 ENCOUNTER — Telehealth: Payer: Self-pay

## 2019-07-16 NOTE — Telephone Encounter (Signed)
CONFIRMED AND SCREENED FOR 07-21-19 OV.

## 2019-07-19 ENCOUNTER — Ambulatory Visit: Payer: Medicaid Other | Attending: Internal Medicine

## 2019-07-19 ENCOUNTER — Ambulatory Visit: Payer: BC Managed Care – PPO

## 2019-07-19 DIAGNOSIS — Z23 Encounter for immunization: Secondary | ICD-10-CM

## 2019-07-19 NOTE — Progress Notes (Signed)
   Covid-19 Vaccination Clinic  Name:  ESSA AREHART    MRN: [id number] DOB: 08-May-1970  07/19/2019  Ms. Deibel was observed post Covid-19 immunization for 15 minutes without incident. She was provided with Vaccine Information Sheet and instruction to access the V-Safe system.   Ms. Drinnon was instructed to call 911 with any severe reactions post vaccine: Marland Kitchen Difficulty breathing  . Swelling of face and throat  . A fast heartbeat  . A bad rash all over body  . Dizziness and weakness   Immunizations Administered    Name Date Dose VIS Date Route   Moderna COVID-19 Vaccine 07/19/2019  2:12 PM 0.5 mL 03/25/2019 Intramuscular   Manufacturer: Levan Hurst   [id number]   [place]:[id number]

## 2019-07-21 ENCOUNTER — Ambulatory Visit: Payer: 59 | Admitting: Nurse Practitioner

## 2019-07-21 ENCOUNTER — Other Ambulatory Visit: Payer: Self-pay

## 2019-07-21 ENCOUNTER — Encounter: Payer: Self-pay | Admitting: Nurse Practitioner

## 2019-07-21 VITALS — BP 112/82 | HR 89 | Temp 96.8°F | Resp 16 | Ht 66.0 in | Wt 140.2 lb

## 2019-07-21 DIAGNOSIS — E039 Hypothyroidism, unspecified: Secondary | ICD-10-CM | POA: Diagnosis not present

## 2019-07-21 DIAGNOSIS — I10 Essential (primary) hypertension: Secondary | ICD-10-CM

## 2019-07-21 DIAGNOSIS — E042 Nontoxic multinodular goiter: Secondary | ICD-10-CM

## 2019-07-21 MED ORDER — LEVOTHYROXINE SODIUM 88 MCG PO TABS: 88.0000 ug | ORAL_TABLET | Freq: Every day | ORAL | 3 refills | Status: DC

## 2019-07-21 NOTE — Progress Notes (Signed)
[facility] [place], [place] [address]  Internal MEDICINE  Office Visit Note  Patient Name: Rebekah Mclean  [id number]  [id number]  Date of Service: 08/03/2019  Chief Complaint  Patient presents with  . Hypothyroidism  . Hypertension  . Gastroesophageal Reflux  . Follow-up    review labs    The patient is here for follow up of lab work. Recently increase her levothyroxine dose to 53mcg daily. Her most recent thyroid panel was within normal limits. Her white blood cell count, previously elevated. Most recent labs indicate normal WBC. She states that she is feeling well. She has no new concerns or complaints today. Blood pressure is well controlled.       Current Medication: Outpatient Encounter Medications as of 07/21/2019  Medication Sig  . acyclovir (ZOVIRAX) 400 MG tablet Take 1 tablet (400 mg total) by mouth 5 (five) times daily.  . carvedilol (COREG) 12.5 MG tablet Take 1 tablet (12.5 mg total) by mouth 2 (two) times daily with a meal.  . Elagolix-Estrad-Noreth & Elago (ORIAHNN) 300-1-0.5 & 300 MG CPPK Take 1 tablet by mouth 2 (two) times daily.  . ergocalciferol (DRISDOL) 1.25 MG (50000 UT) capsule Take 1 capsule (50,000 Units total) by mouth once a week.  . fluticasone (FLONASE) 50 MCG/ACT nasal spray Place 2 sprays into both nostrils daily.  Marland Kitchen ibuprofen (ADVIL,MOTRIN) 800 MG tablet Take 800 mg by mouth 3 (three) times daily as needed.  Marland Kitchen levothyroxine (SYNTHROID) 88 MCG tablet Take 1 tablet (88 mcg total) by mouth daily before breakfast.  . Multiple Vitamins-Minerals (ONE-A-DAY WOMENS PO) Take by mouth.  . rosuvastatin (CRESTOR) 5 MG tablet Take 1 tablet (5 mg total) by mouth daily.  . valsartan-hydrochlorothiazide (DIOVAN-HCT) 160-12.5 MG tablet Take 1 tablet by mouth daily.  . [DISCONTINUED] levothyroxine (SYNTHROID) 88 MCG tablet Take 1 tablet (88 mcg total) by mouth daily before breakfast.   No facility-administered encounter medications on  file as of 07/21/2019.    Surgical History: History reviewed. No pertinent surgical history.  Medical History: Past Medical History:  Diagnosis Date  . GERD (gastroesophageal reflux disease)   . Hypertension   . Hypothyroidism   . Irregular menses     Family History: Family History  Problem Relation Age of Onset  . Diabetes Mother   . Hypertension Mother   . Diabetes Father   . Hypertension Father   . Breast cancer Neg Hx     Social History   Socioeconomic History  . Marital status: Married    Spouse name: Not on file  . Number of children: Not on file  . Years of education: Not on file  . Highest education level: Not on file  Occupational History  . Not on file  Tobacco Use  . Smoking status: Never Smoker  . Smokeless tobacco: Never Used  Substance and Sexual Activity  . Alcohol use: No  . Drug use: No  . Sexual activity: Yes    Birth control/protection: None  Other Topics Concern  . Not on file  Social History Narrative  . Not on file   Social Determinants of Health   Financial Resource Strain:   . Difficulty of Paying Living Expenses:   Food Insecurity:   . Worried About Charity fundraiser in the Last Year:   . Arboriculturist in the Last Year:   Transportation Needs:   . Film/video editor (Medical):   Marland Kitchen Lack of Transportation (Non-Medical):   Physical  Activity:   . Days of Exercise per Week:   . Minutes of Exercise per Session:   Stress:   . Feeling of Stress :   Social Connections:   . Frequency of Communication with Friends and Family:   . Frequency of Social Gatherings with Friends and Family:   . Attends Religious Services:   . Active Member of Clubs or Organizations:   . Attends Archivist Meetings:   Marland Kitchen Marital Status:   Intimate Partner Violence:   . Fear of Current or Ex-Partner:   . Emotionally Abused:   Marland Kitchen Physically Abused:   . Sexually Abused:       Review of Systems  Constitutional: Negative for appetite  change, chills, fatigue, fever and unexpected weight change.  HENT: Negative for congestion, ear pain, postnasal drip, rhinorrhea, sinus pressure, sinus pain, sneezing, sore throat and tinnitus.   Respiratory: Negative for cough, chest tightness, shortness of breath and wheezing.   Cardiovascular: Negative for chest pain and palpitations.  Gastrointestinal: Negative for abdominal pain, constipation, diarrhea, nausea and vomiting.  Endocrine: Negative for cold intolerance, heat intolerance, polydipsia and polyuria.       Most recent thyroid panel is normal.   Musculoskeletal: Negative for arthralgias, back pain, joint swelling and neck pain.  Skin: Negative for rash and wound.  Allergic/Immunologic: Negative for environmental allergies.  Neurological: Negative for tremors, light-headedness, numbness and headaches.  Hematological: Negative for adenopathy. Does not bruise/bleed easily.  Psychiatric/Behavioral: Negative for behavioral problems (Depression), dysphoric mood, sleep disturbance and suicidal ideas. The patient is not nervous/anxious.     Today's Vitals   07/21/19 1114  BP: 112/82  Pulse: 89  Resp: 16  Temp: (!) 96.8 F (36 C)  SpO2: 99%  Weight: 140 lb 3.2 oz (63.6 kg)  Height: 5\' 6"  (1.676 m)   Body mass index is 22.63 kg/m.  Physical Exam Vitals and nursing note reviewed.  Constitutional:      General: She is not in acute distress.    Appearance: Normal appearance. She is well-developed. She is not diaphoretic.  HENT:     Head: Normocephalic and atraumatic.     Nose: Nose normal.     Mouth/Throat:     Pharynx: No oropharyngeal exudate.  Eyes:     Pupils: Pupils are equal, round, and reactive to light.  Neck:     Thyroid: Thyromegaly present.     Vascular: No carotid bruit or JVD.     Trachea: No tracheal deviation.  Cardiovascular:     Rate and Rhythm: Normal rate and regular rhythm.     Heart sounds: Normal heart sounds. No murmur. No friction rub. No gallop.    Pulmonary:     Effort: Pulmonary effort is normal. No respiratory distress.     Breath sounds: Normal breath sounds. No wheezing or rales.  Chest:     Chest wall: No tenderness.  Abdominal:     General: Bowel sounds are normal.     Palpations: Abdomen is soft.  Musculoskeletal:        General: Normal range of motion.     Cervical back: Normal range of motion and neck supple.  Lymphadenopathy:     Cervical: No cervical adenopathy.  Skin:    General: Skin is warm and dry.  Neurological:     Mental Status: She is alert and oriented to person, place, and time.     Cranial Nerves: No cranial nerve deficit.  Psychiatric:  Behavior: Behavior normal.        Thought Content: Thought content normal.        Judgment: Judgment normal.   Assessment/Plan: 1. Hypothyroidism, unspecified type Thyroid panel stable. Continue levothyroxine at 84mcg every day. Will get ultrasound of thyroid for further evaluation.  - levothyroxine (SYNTHROID) 88 MCG tablet; Take 1 tablet (88 mcg total) by mouth daily before breakfast.  Dispense: 30 tablet; Refill: 3 - US THYROID; Future  2. Multinodular goiter Will get ultrasound of thyroid for further evaluation.  - US THYROID; Future  3. Essential hypertension Stable. Continue bp medication as prescribed.   General Counseling: Aleysia verbalizes understanding of the findings of todays visit and agrees with plan of treatment. I have discussed any further diagnostic evaluation that may be needed or ordered today. We also reviewed her medications today. she has been encouraged to call the office with any questions or concerns that should arise related to todays visit.  This patient was seen by Leretha Pol FNP Collaboration with Dr Lavera Guise as a part of collaborative care agreement  Orders Placed This Encounter  Procedures  . US THYROID    Meds ordered this encounter  Medications  . levothyroxine (SYNTHROID) 88 MCG tablet    Sig: Take 1  tablet (88 mcg total) by mouth daily before breakfast.    Dispense:  30 tablet    Refill:  3    Order Specific Question:   Supervising Provider    Answer:   Lavera Guise [id number]    Total time spent: 25 Minutes   Time spent includes review of chart, medications, test results, and follow up plan with the patient.      Dr Lavera Guise Internal medicine

## 2019-07-23 ENCOUNTER — Ambulatory Visit: Payer: BC Managed Care – PPO

## 2019-08-08 ENCOUNTER — Other Ambulatory Visit: Payer: Self-pay

## 2019-08-08 ENCOUNTER — Ambulatory Visit (INDEPENDENT_AMBULATORY_CARE_PROVIDER_SITE_OTHER): Payer: 59

## 2019-08-08 DIAGNOSIS — E039 Hypothyroidism, unspecified: Secondary | ICD-10-CM | POA: Diagnosis not present

## 2019-08-08 DIAGNOSIS — E042 Nontoxic multinodular goiter: Secondary | ICD-10-CM

## 2019-08-13 ENCOUNTER — Telehealth: Payer: Self-pay

## 2019-08-13 NOTE — Telephone Encounter (Signed)
Confirmed and screened for 08-15-19 ov. °

## 2019-08-15 ENCOUNTER — Ambulatory Visit: Payer: 59 | Admitting: Nurse Practitioner

## 2019-08-15 ENCOUNTER — Telehealth: Payer: Self-pay | Admitting: Obstetrics and Gynecology

## 2019-08-15 ENCOUNTER — Other Ambulatory Visit: Payer: Self-pay

## 2019-08-15 ENCOUNTER — Encounter: Payer: Self-pay | Admitting: Nurse Practitioner

## 2019-08-15 VITALS — BP 141/87 | HR 82 | Temp 97.1°F | Resp 16 | Ht 66.0 in | Wt 144.0 lb

## 2019-08-15 DIAGNOSIS — E042 Nontoxic multinodular goiter: Secondary | ICD-10-CM

## 2019-08-15 DIAGNOSIS — J300 Vasomotor rhinitis: Secondary | ICD-10-CM | POA: Diagnosis not present

## 2019-08-15 DIAGNOSIS — I10 Essential (primary) hypertension: Secondary | ICD-10-CM | POA: Diagnosis not present

## 2019-08-15 MED ORDER — FLUTICASONE PROPIONATE 50 MCG/ACT NA SUSP: 2.0000 | Freq: Every day | NASAL | 5 refills | Status: DC

## 2019-08-15 NOTE — Telephone Encounter (Signed)
Pt called in and stated that she is requesting a call back to go over her lab results. Please advise

## 2019-08-15 NOTE — Progress Notes (Signed)
[facility] [place], [place] [address]  Internal MEDICINE  Office Visit Note  Patient Name: Rebekah Mclean  [id number]  [id number]  Date of Service: 09/01/2019  Chief Complaint  Patient presents with  . Follow-up    [place] results  . Gastroesophageal Reflux    The patient is here for routine follow up. She had thyroid ultrasound prior to this visit and is here to review the results. Ultrasound indicates small thyroid gland with some heterogenous changes. This is consistent with hypothyroid disease. She states that she feels well. Is havingn some trouble with nasal congestion since the start of increased pollen.       Current Medication: Outpatient Encounter Medications as of 08/15/2019  Medication Sig  . acyclovir (ZOVIRAX) 400 MG tablet Take 1 tablet (400 mg total) by mouth 5 (five) times daily.  . carvedilol (COREG) 12.5 MG tablet Take 1 tablet (12.5 mg total) by mouth 2 (two) times daily with a meal.  . fluticasone (FLONASE) 50 MCG/ACT nasal spray Place 2 sprays into both nostrils daily.  Marland Kitchen levothyroxine (SYNTHROID) 88 MCG tablet Take 1 tablet (88 mcg total) by mouth daily before breakfast.  . rosuvastatin (CRESTOR) 5 MG tablet Take 1 tablet (5 mg total) by mouth daily.  . valsartan-hydrochlorothiazide (DIOVAN-HCT) 160-12.5 MG tablet Take 1 tablet by mouth daily.  . [DISCONTINUED] fluticasone (FLONASE) 50 MCG/ACT nasal spray Place 2 sprays into both nostrils daily.  [employer] & Elago (ORIAHNN) 300-1-0.5 & 300 MG CPPK Take 1 tablet by mouth 2 (two) times daily. (Patient not taking: Reported on 08/15/2019)  . ergocalciferol (DRISDOL) 1.25 MG (50000 UT) capsule Take 1 capsule (50,000 Units total) by mouth once a week. (Patient not taking: Reported on 08/15/2019)  . ibuprofen (ADVIL,MOTRIN) 800 MG tablet Take 800 mg by mouth 3 (three) times daily as needed.  . Multiple Vitamins-Minerals (ONE-A-DAY WOMENS PO) Take by mouth.   No  facility-administered encounter medications on file as of 08/15/2019.    Surgical History: History reviewed. No pertinent surgical history.  Medical History: Past Medical History:  Diagnosis Date  . GERD (gastroesophageal reflux disease)   . Hypertension   . Hypothyroidism   . Irregular menses     Family History: Family History  Problem Relation Age of Onset  . Diabetes Mother   . Hypertension Mother   . Diabetes Father   . Hypertension Father   . Breast cancer Neg Hx     Social History   Socioeconomic History  . Marital status: Married    Spouse name: Not on file  . Number of children: Not on file  . Years of education: Not on file  . Highest education level: Not on file  Occupational History  . Not on file  Tobacco Use  . Smoking status: Never Smoker  . Smokeless tobacco: Never Used  Substance and Sexual Activity  . Alcohol use: No  . Drug use: No  . Sexual activity: Yes    Birth control/protection: None  Other Topics Concern  . Not on file  Social History Narrative  . Not on file   Social Determinants of Health   Financial Resource Strain:   . Difficulty of Paying Living Expenses:   Food Insecurity:   . Worried About Charity fundraiser in the Last Year:   . Arboriculturist in the Last Year:   Transportation Needs:   . Film/video editor (Medical):   Marland Kitchen Lack of Transportation (Non-Medical):   Physical  Activity:   . Days of Exercise per Week:   . Minutes of Exercise per Session:   Stress:   . Feeling of Stress :   Social Connections:   . Frequency of Communication with Friends and Family:   . Frequency of Social Gatherings with Friends and Family:   . Attends Religious Services:   . Active Member of Clubs or Organizations:   . Attends Archivist Meetings:   Marland Kitchen Marital Status:   Intimate Partner Violence:   . Fear of Current or Ex-Partner:   . Emotionally Abused:   Marland Kitchen Physically Abused:   . Sexually Abused:       Review of  Systems  Constitutional: Negative for activity change, appetite change, chills, fatigue, fever and unexpected weight change.  HENT: Positive for rhinorrhea and sinus pressure. Negative for congestion, ear pain, postnasal drip, sinus pain, sneezing, sore throat and tinnitus.   Respiratory: Negative for cough, chest tightness, shortness of breath and wheezing.   Cardiovascular: Negative for chest pain and palpitations.  Gastrointestinal: Negative for abdominal pain, constipation, diarrhea, nausea and vomiting.  Endocrine: Negative for cold intolerance, heat intolerance, polydipsia and polyuria.       Most recent thyroid panel is normal.   Musculoskeletal: Negative for arthralgias, back pain, joint swelling and neck pain.  Skin: Negative for rash and wound.  Allergic/Immunologic: Positive for environmental allergies.  Neurological: Negative for tremors, light-headedness, numbness and headaches.  Hematological: Negative for adenopathy. Does not bruise/bleed easily.  Psychiatric/Behavioral: Negative for behavioral problems (Depression), dysphoric mood, sleep disturbance and suicidal ideas. The patient is not nervous/anxious.     Today's Vitals   08/15/19 1101  BP: (!) 141/87  Pulse: 82  Resp: 16  Temp: (!) 97.1 F (36.2 C)  SpO2: 100%  Weight: 144 lb (65.3 kg)  Height: 5\' 6"  (1.676 m)   Body mass index is 23.24 kg/m.  Physical Exam Vitals and nursing note reviewed.  Constitutional:      General: She is not in acute distress.    Appearance: Normal appearance. She is well-developed. She is not diaphoretic.  HENT:     Head: Normocephalic and atraumatic.     Nose: Nose normal.     Mouth/Throat:     Pharynx: No oropharyngeal exudate.  Eyes:     Pupils: Pupils are equal, round, and reactive to light.  Neck:     Thyroid: Thyromegaly present.     Vascular: No carotid bruit or JVD.     Trachea: No tracheal deviation.  Cardiovascular:     Rate and Rhythm: Normal rate and regular  rhythm.     Heart sounds: Normal heart sounds. No murmur. No friction rub. No gallop.   Pulmonary:     Effort: Pulmonary effort is normal. No respiratory distress.     Breath sounds: Normal breath sounds. No wheezing or rales.  Chest:     Chest wall: No tenderness.  Abdominal:     Palpations: Abdomen is soft.  Musculoskeletal:        General: Normal range of motion.     Cervical back: Normal range of motion and neck supple.  Lymphadenopathy:     Cervical: No cervical adenopathy.  Skin:    General: Skin is warm and dry.  Neurological:     Mental Status: She is alert and oriented to person, place, and time.     Cranial Nerves: No cranial nerve deficit.  Psychiatric:        Mood and Affect: Mood normal.  Behavior: Behavior normal.        Thought Content: Thought content normal.        Judgment: Judgment normal.    Assessment/Plan: 1. Multinodular goiter Reviewed results of thyroid ultrasound with the patient. She has small thyroid gland with some heterogenous changes. Thyroid panel now normal. Continue current dose levothyroxine. Repeat thyroid ultrasound in one year.   2. Vasomotor rhinitis Add flonase nasal spray daily. Recommend OTC use of claritin or zyrtec everyday.  - fluticasone (FLONASE) 50 MCG/ACT nasal spray; Place 2 sprays into both nostrils daily.  Dispense: 16 g; Refill: 5  3. Essential hypertension Generally stable. Continue bp medication as prescribed   General Counseling: Vivianne verbalizes understanding of the findings of todays visit and agrees with plan of treatment. I have discussed any further diagnostic evaluation that may be needed or ordered today. We also reviewed her medications today. she has been encouraged to call the office with any questions or concerns that should arise related to todays visit.   Hypertension Counseling:   The following hypertensive lifestyle modification were recommended and discussed:  1. Limiting alcohol intake to less  than 1 oz/day of ethanol:(24 oz of beer or 8 oz of wine or 2 oz of 100-proof whiskey). 2. Take baby ASA 81 mg daily. 3. Importance of regular aerobic exercise and losing weight. 4. Reduce dietary saturated fat and cholesterol intake for overall cardiovascular health. 5. Maintaining adequate dietary potassium, calcium, and magnesium intake. 6. Regular monitoring of the blood pressure. 7. Reduce sodium intake to less than 100 mmol/day (less than 2.3 gm of sodium or less than 6 gm of sodium choride)   This patient was seen by [place] with Dr Lavera Guise as a part of collaborative care agreement  Meds ordered this encounter  Medications  . fluticasone (FLONASE) 50 MCG/ACT nasal spray    Sig: Place 2 sprays into both nostrils daily.    Dispense:  16 g    Refill:  5    Order Specific Question:   Supervising Provider    Answer:   Lavera Guise [id number]    Total time spent: 25 Minutes  Time spent includes review of chart, medications, test results, and follow up plan with the patient.      Dr Lavera Guise Internal medicine

## 2019-08-19 NOTE — Telephone Encounter (Signed)
Pt was called and went over test results sent to her via mychart. Pt stated that she still did not understand. Sent AC a message to explain the information to the pt.

## 2019-11-17 ENCOUNTER — Other Ambulatory Visit: Payer: Self-pay

## 2019-11-17 DIAGNOSIS — E039 Hypothyroidism, unspecified: Secondary | ICD-10-CM

## 2019-11-17 MED ORDER — LEVOTHYROXINE SODIUM 88 MCG PO TABS: 88.0000 ug | ORAL_TABLET | Freq: Every day | ORAL | 3 refills | Status: DC

## 2019-12-23 ENCOUNTER — Encounter: Payer: 59 | Admitting: Nurse Practitioner

## 2019-12-26 ENCOUNTER — Encounter: Payer: 59 | Admitting: Nurse Practitioner

## 2020-02-17 ENCOUNTER — Encounter: Payer: Self-pay | Admitting: Nurse Practitioner

## 2020-02-17 ENCOUNTER — Ambulatory Visit (INDEPENDENT_AMBULATORY_CARE_PROVIDER_SITE_OTHER): Payer: 59 | Admitting: Nurse Practitioner

## 2020-02-17 ENCOUNTER — Other Ambulatory Visit: Payer: Self-pay

## 2020-02-17 VITALS — BP 142/86 | HR 100 | Temp 97.6°F | Resp 16 | Ht 66.0 in | Wt 150.6 lb

## 2020-02-17 DIAGNOSIS — E782 Mixed hyperlipidemia: Secondary | ICD-10-CM

## 2020-02-17 DIAGNOSIS — I10 Essential (primary) hypertension: Secondary | ICD-10-CM

## 2020-02-17 DIAGNOSIS — E039 Hypothyroidism, unspecified: Secondary | ICD-10-CM | POA: Diagnosis not present

## 2020-02-17 DIAGNOSIS — Z0001 Encounter for general adult medical examination with abnormal findings: Secondary | ICD-10-CM | POA: Diagnosis not present

## 2020-02-17 DIAGNOSIS — Z1231 Encounter for screening mammogram for malignant neoplasm of breast: Secondary | ICD-10-CM

## 2020-02-17 DIAGNOSIS — E042 Nontoxic multinodular goiter: Secondary | ICD-10-CM

## 2020-02-17 NOTE — Progress Notes (Signed)
[facility] [place], [place] [address]  Internal MEDICINE  Office Visit Note  Patient Name: Rebekah Mclean  [id number]  [id number]  Date of Service: 03/07/2020   Pt is here for routine health maintenance examination  Chief Complaint  Patient presents with  . Follow-up  . Gastroesophageal Reflux  . Hypertension  . Quality Metric Gaps    flu,tetnaus,Hep C  . controlled substance form    reviewed with PT     The patient is here for health maintenance exam. Blood pressure is mildly elevated at start of visit. Improved to normal levels throughout the visit. Her most recent thyroid panel was normal. She will be due to have a new thyroid ultrasound in six months for surveillance of thyroid nodule. She has no new concerns or complaints.  Her last mammogram was 05/06/2019 and was negative. She has been seeing GYN provider for well-woman checks.     Current Medication: Outpatient Encounter Medications as of 02/17/2020  Medication Sig  . fluticasone (FLONASE) 50 MCG/ACT nasal spray Place 2 sprays into both nostrils daily.  Marland Kitchen ibuprofen (ADVIL,MOTRIN) 800 MG tablet Take 800 mg by mouth 3 (three) times daily as needed.  Marland Kitchen levothyroxine (SYNTHROID) 88 MCG tablet Take 1 tablet (88 mcg total) by mouth daily before breakfast.  . Multiple Vitamins-Minerals (ONE-A-DAY WOMENS PO) Take by mouth.  . rosuvastatin (CRESTOR) 5 MG tablet Take 1 tablet (5 mg total) by mouth daily.  . [DISCONTINUED] acyclovir (ZOVIRAX) 400 MG tablet Take 1 tablet (400 mg total) by mouth 5 (five) times daily.  . [DISCONTINUED] carvedilol (COREG) 12.5 MG tablet Take 1 tablet (12.5 mg total) by mouth 2 (two) times daily with a meal.  . [DISCONTINUED] valsartan-hydrochlorothiazide (DIOVAN-HCT) 160-12.5 MG tablet Take 1 tablet by mouth daily.  . [DISCONTINUED] Elagolix-Estrad-Noreth & Elago (ORIAHNN) 300-1-0.5 & 300 MG CPPK Take 1 tablet by mouth 2 (two) times daily. (Patient not taking: Reported on  08/15/2019)  . [DISCONTINUED] ergocalciferol (DRISDOL) 1.25 MG (50000 UT) capsule Take 1 capsule (50,000 Units total) by mouth once a week. (Patient not taking: Reported on 08/15/2019)   No facility-administered encounter medications on file as of 02/17/2020.    Surgical History: History reviewed. No pertinent surgical history.  Medical History: Past Medical History:  Diagnosis Date  . GERD (gastroesophageal reflux disease)   . Hypertension   . Hypothyroidism   . Irregular menses     Family History: Family History  Problem Relation Age of Onset  . Diabetes Mother   . Hypertension Mother   . Diabetes Father   . Hypertension Father   . Breast cancer Neg Hx       Review of Systems  Constitutional: Negative for activity change, chills, fatigue and unexpected weight change.  HENT: Negative for congestion, postnasal drip, rhinorrhea, sneezing and sore throat.   Respiratory: Negative for cough, chest tightness and shortness of breath.   Cardiovascular: Negative for chest pain and palpitations.  Gastrointestinal: Negative for abdominal pain, constipation, diarrhea, nausea and vomiting.  Endocrine: Negative for cold intolerance, heat intolerance, polydipsia and polyuria.       Most recent thyroid panel was normal.   Musculoskeletal: Negative for arthralgias, back pain, joint swelling and neck pain.  Skin: Negative for rash.  Allergic/Immunologic: Negative for environmental allergies.  Neurological: Negative for dizziness, tremors, numbness and headaches.  Hematological: Negative for adenopathy. Does not bruise/bleed easily.  Psychiatric/Behavioral: Negative for behavioral problems (Depression), sleep disturbance and suicidal ideas. The patient is not nervous/anxious.  Today's Vitals   02/17/20 1505  BP: (!) 142/86  Pulse: 100  Resp: 16  Temp: 97.6 F (36.4 C)  SpO2: 99%  Weight: 150 lb 9.6 oz (68.3 kg)  Height: 5\' 6"  (1.676 m)   Body mass index is 24.31  kg/m.  Physical Exam Vitals and nursing note reviewed.  Constitutional:      General: She is not in acute distress.    Appearance: Normal appearance. She is well-developed. She is not diaphoretic.  HENT:     Head: Normocephalic and atraumatic.     Nose: Nose normal.     Mouth/Throat:     Pharynx: No oropharyngeal exudate.  Eyes:     Pupils: Pupils are equal, round, and reactive to light.  Neck:     Thyroid: Thyromegaly present.     Vascular: No carotid bruit or JVD.     Trachea: No tracheal deviation.  Cardiovascular:     Rate and Rhythm: Normal rate and regular rhythm.     Pulses: Normal pulses.     Heart sounds: Normal heart sounds. No murmur heard.  No friction rub. No gallop.   Pulmonary:     Effort: Pulmonary effort is normal. No respiratory distress.     Breath sounds: Normal breath sounds. No wheezing or rales.  Chest:     Chest wall: No tenderness.  Abdominal:     General: Bowel sounds are normal.     Palpations: Abdomen is soft.     Tenderness: There is no abdominal tenderness.  Musculoskeletal:        General: Normal range of motion.     Cervical back: Normal range of motion and neck supple.  Lymphadenopathy:     Cervical: No cervical adenopathy.  Skin:    General: Skin is warm and dry.  Neurological:     Mental Status: She is alert and oriented to person, place, and time.     Cranial Nerves: No cranial nerve deficit.  Psychiatric:        Behavior: Behavior normal.        Thought Content: Thought content normal.        Judgment: Judgment normal.     Assessment/Plan: 1. Encounter for general adult medical examination with abnormal findings Annual health maintenance exam today. Order slip given to check routine, fasting labs prior to her next visit.   2. Essential hypertension Stable. Continue bp medication as prescribed   3. Hypothyroidism, unspecified type Most recent thyroid panel stable. Continue levothyroxine as prescribed   4. Multinodular  goiter Repeat thyroid ultrasound in six months for surveillance.  - US THYROID; Future  5. Mixed hyperlipidemia Continue rosuvastatin as prescribed   6. Encounter for screening mammogram for malignant neoplasm of breast - MM DIGITAL SCREENING BILATERAL; Future  General Counseling: Tambra verbalizes understanding of the findings of todays visit and agrees with plan of treatment. I have discussed any further diagnostic evaluation that may be needed or ordered today. We also reviewed her medications today. she has been encouraged to call the office with any questions or concerns that should arise related to todays visit.    Counseling:  This patient was seen by Leretha Pol FNP Collaboration with Dr Lavera Guise as a part of collaborative care agreement  Orders Placed This Encounter  Procedures  . MM DIGITAL SCREENING BILATERAL  . US THYROID    Total time spent: 35 Minutes  Time spent includes review of chart, medications, test results, and follow up plan with the  patient.     Lavera Guise, MD  Internal Medicine

## 2020-02-20 ENCOUNTER — Other Ambulatory Visit: Payer: Self-pay

## 2020-02-20 DIAGNOSIS — I10 Essential (primary) hypertension: Secondary | ICD-10-CM

## 2020-02-20 MED ORDER — CARVEDILOL 12.5 MG PO TABS: 12.5000 mg | ORAL_TABLET | Freq: Two times a day (BID) | ORAL | 3 refills | Status: DC

## 2020-03-01 ENCOUNTER — Other Ambulatory Visit: Payer: Self-pay

## 2020-03-01 DIAGNOSIS — B001 Herpesviral vesicular dermatitis: Secondary | ICD-10-CM

## 2020-03-01 DIAGNOSIS — I10 Essential (primary) hypertension: Secondary | ICD-10-CM

## 2020-03-01 MED ORDER — ACYCLOVIR 400 MG PO TABS: 400.0000 mg | ORAL_TABLET | Freq: Every day | ORAL | 3 refills | Status: DC

## 2020-03-01 MED ORDER — VALSARTAN-HYDROCHLOROTHIAZIDE 160-12.5 MG PO TABS: 1.0000 | ORAL_TABLET | Freq: Every day | ORAL | 3 refills | Status: DC

## 2020-03-07 DIAGNOSIS — Z1231 Encounter for screening mammogram for malignant neoplasm of breast: Secondary | ICD-10-CM | POA: Insufficient documentation

## 2020-03-17 ENCOUNTER — Other Ambulatory Visit: Payer: Self-pay

## 2020-03-17 DIAGNOSIS — E039 Hypothyroidism, unspecified: Secondary | ICD-10-CM

## 2020-03-17 MED ORDER — LEVOTHYROXINE SODIUM 88 MCG PO TABS: 88.0000 ug | ORAL_TABLET | Freq: Every day | ORAL | 3 refills | Status: DC

## 2020-04-14 ENCOUNTER — Other Ambulatory Visit: Payer: Self-pay | Admitting: Nurse Practitioner

## 2020-04-14 DIAGNOSIS — Z1231 Encounter for screening mammogram for malignant neoplasm of breast: Secondary | ICD-10-CM

## 2020-04-27 ENCOUNTER — Other Ambulatory Visit: Payer: Self-pay

## 2020-04-29 ENCOUNTER — Encounter: Payer: Self-pay | Admitting: Nurse Practitioner

## 2020-04-29 ENCOUNTER — Other Ambulatory Visit: Payer: Self-pay | Admitting: Nurse Practitioner

## 2020-04-29 ENCOUNTER — Ambulatory Visit: Payer: 59 | Admitting: Nurse Practitioner

## 2020-04-29 ENCOUNTER — Other Ambulatory Visit: Payer: Self-pay

## 2020-04-29 VITALS — BP 132/88 | HR 90 | Temp 97.3°F | Resp 16 | Ht 66.0 in | Wt 150.6 lb

## 2020-04-29 DIAGNOSIS — N959 Unspecified menopausal and perimenopausal disorder: Secondary | ICD-10-CM | POA: Diagnosis not present

## 2020-04-29 DIAGNOSIS — E039 Hypothyroidism, unspecified: Secondary | ICD-10-CM

## 2020-04-29 DIAGNOSIS — I10 Essential (primary) hypertension: Secondary | ICD-10-CM

## 2020-04-29 NOTE — Progress Notes (Signed)
[facility] [address] [place], [place] [address]  Internal MEDICINE  Office Visit Note  Patient Name: Rebekah Mclean  [id number]  [id number]  Date of Service: 05/14/2020   Pt is here for a sick visit.  Chief Complaint  Patient presents with  . ACUTE    On current thyroid medicine she hot all the time      The patient is here for acute visit.  -hot flashes -light period in December -symptoms started when thyroid pill changed. She is currently on levothyroxine daily. Same dose for over a year. States that color and shape of thyroid pill changed in December and that is when symptoms started.         Current Medication:  Outpatient Encounter Medications as of 04/29/2020  Medication Sig  . acyclovir (ZOVIRAX) 400 MG tablet Take 1 tablet (400 mg total) by mouth 5 (five) times daily.  . carvedilol (COREG) 12.5 MG tablet Take 1 tablet (12.5 mg total) by mouth 2 (two) times daily with a meal.  . fluticasone (FLONASE) 50 MCG/ACT nasal spray Place 2 sprays into both nostrils daily.  Marland Kitchen ibuprofen (ADVIL,MOTRIN) 800 MG tablet Take 800 mg by mouth 3 (three) times daily as needed.  Marland Kitchen levothyroxine (SYNTHROID) 88 MCG tablet Take 1 tablet (88 mcg total) by mouth daily before breakfast.  . Multiple Vitamins-Minerals (ONE-A-DAY WOMENS PO) Take by mouth.  . rosuvastatin (CRESTOR) 5 MG tablet Take 1 tablet (5 mg total) by mouth daily.  . valsartan-hydrochlorothiazide (DIOVAN-HCT) 160-12.5 MG tablet Take 1 tablet by mouth daily.   No facility-administered encounter medications on file as of 04/29/2020.      Medical History: Past Medical History:  Diagnosis Date  . GERD (gastroesophageal reflux disease)   . Hypertension   . Hypothyroidism   . Irregular menses      Today's Vitals   04/29/20 1458  BP: 132/88  Pulse: 90  Resp: 16  Temp: (!) 97.3 F (36.3 C)  SpO2: 99%  Weight: 150 lb 9.6 oz (68.3 kg)  Height: 5\' 6"  (1.676 m)   Body mass index is 24.31  kg/m.  Review of Systems  Constitutional: Positive for fatigue. Negative for activity change, chills and unexpected weight change.  HENT: Negative for congestion, postnasal drip, rhinorrhea, sneezing and sore throat.   Respiratory: Negative for cough, chest tightness, shortness of breath and wheezing.   Cardiovascular: Negative for chest pain and palpitations.  Gastrointestinal: Negative for abdominal pain, constipation, diarrhea, nausea and vomiting.  Endocrine: Negative for cold intolerance, heat intolerance, polydipsia and polyuria.       Believes her thyroid might be messed up.   Genitourinary: Positive for menstrual problem. Negative for dysuria and frequency.       Unusually light and irregular menstural cycles.   Musculoskeletal: Negative for arthralgias, back pain, joint swelling and neck pain.  Skin: Negative for rash.  Neurological: Negative for dizziness, tremors, numbness and headaches.  Hematological: Negative for adenopathy. Does not bruise/bleed easily.  Psychiatric/Behavioral: Negative for behavioral problems (Depression), sleep disturbance and suicidal ideas. The patient is not nervous/anxious.     Physical Exam Vitals and nursing note reviewed.  Constitutional:      General: She is not in acute distress.    Appearance: Normal appearance. She is well-developed and well-nourished. She is not diaphoretic.  HENT:     Head: Normocephalic and atraumatic.     Nose: Nose normal.     Mouth/Throat:     Mouth: Oropharynx is clear and  moist.     Pharynx: No oropharyngeal exudate.  Eyes:     Extraocular Movements: EOM normal.     Pupils: Pupils are equal, round, and reactive to light.  Neck:     Thyroid: No thyromegaly.     Vascular: No JVD.     Trachea: No tracheal deviation.  Cardiovascular:     Rate and Rhythm: Normal rate and regular rhythm.     Heart sounds: Normal heart sounds. No murmur heard. No friction rub. No gallop.   Pulmonary:     Effort: Pulmonary effort  is normal. No respiratory distress.     Breath sounds: Normal breath sounds. No wheezing or rales.  Chest:     Chest wall: No tenderness.  Abdominal:     General: Bowel sounds are normal.     Palpations: Abdomen is soft.     Tenderness: There is no abdominal tenderness.  Musculoskeletal:        General: Normal range of motion.     Cervical back: Normal range of motion and neck supple.  Lymphadenopathy:     Cervical: No cervical adenopathy.  Skin:    General: Skin is warm and dry.     Capillary Refill: Capillary refill takes less than 2 seconds.  Neurological:     General: No focal deficit present.     Mental Status: She is alert and oriented to person, place, and time.     Cranial Nerves: No cranial nerve deficit.  Psychiatric:        Mood and Affect: Mood and affect and mood normal.        Behavior: Behavior normal.        Thought Content: Thought content normal.        Judgment: Judgment normal.    Assessment/Plan: 1. Unspecified menopausal and perimenopausal disorder Check reproductive hormones  2. Hypothyroidism, unspecified type Check thyroid panel and adjust levothyroxine dose as indicated.   3. Essential hypertension Stable. Continue bp medication as prescribed   General Counseling: Rebekah Mclean verbalizes understanding of the findings of todays visit and agrees with plan of treatment. I have discussed any further diagnostic evaluation that may be needed or ordered today. We also reviewed her medications today. she has been encouraged to call the office with any questions or concerns that should arise related to todays visit.    Counseling:  This patient was seen by Leretha Pol FNP Collaboration with Dr Lavera Guise as a part of collaborative care agreement  Time spent: 25 Minutes

## 2020-04-30 LAB — T4, FREE: Free T4: 1.34 ng/dL (ref 0.82–1.77)

## 2020-04-30 LAB — FSH/LH
FSH: 49.1 m[IU]/mL
LH: 38.9 m[IU]/mL

## 2020-04-30 LAB — ESTRADIOL: Estradiol: 37.4 pg/mL

## 2020-04-30 LAB — TSH: TSH: 6.55 u[IU]/mL — ABNORMAL HIGH (ref 0.450–4.500)

## 2020-05-13 ENCOUNTER — Telehealth: Payer: Self-pay

## 2020-05-13 NOTE — Telephone Encounter (Signed)
She does have menopause, I will like to see her on Lauren's schedule in one to 2 weeks.

## 2020-05-14 DIAGNOSIS — N959 Unspecified menopausal and perimenopausal disorder: Secondary | ICD-10-CM | POA: Insufficient documentation

## 2020-05-17 ENCOUNTER — Telehealth: Payer: Self-pay

## 2020-05-17 NOTE — Telephone Encounter (Signed)
Pt advised and make 2 weeks follow up appt

## 2020-05-17 NOTE — Telephone Encounter (Signed)
See this

## 2020-05-17 NOTE — Telephone Encounter (Signed)
Please see previous message

## 2020-05-25 ENCOUNTER — Other Ambulatory Visit: Payer: Self-pay

## 2020-05-25 ENCOUNTER — Ambulatory Visit
Admission: RE | Admit: 2020-05-25 | Discharge: 2020-05-25 | Disposition: A | Discharge status: Home / Self Care | Payer: 59 | Source: Ambulatory Visit | Attending: Nurse Practitioner | Admitting: Nurse Practitioner

## 2020-05-25 DIAGNOSIS — Z1231 Encounter for screening mammogram for malignant neoplasm of breast: Secondary | ICD-10-CM | POA: Insufficient documentation

## 2020-05-26 ENCOUNTER — Other Ambulatory Visit: Payer: Self-pay

## 2020-05-26 DIAGNOSIS — I10 Essential (primary) hypertension: Secondary | ICD-10-CM

## 2020-05-26 MED ORDER — VALSARTAN-HYDROCHLOROTHIAZIDE 160-12.5 MG PO TABS: 1.0000 | ORAL_TABLET | Freq: Every day | ORAL | 3 refills | Status: DC

## 2020-05-27 ENCOUNTER — Telehealth: Payer: Self-pay

## 2020-05-27 ENCOUNTER — Other Ambulatory Visit: Payer: Self-pay

## 2020-05-27 DIAGNOSIS — E782 Mixed hyperlipidemia: Secondary | ICD-10-CM

## 2020-05-27 MED ORDER — ROSUVASTATIN CALCIUM 5 MG PO TABS: 5.0000 mg | ORAL_TABLET | Freq: Every day | ORAL | 1 refills | Status: DC

## 2020-05-27 MED ORDER — THYROID 60 MG PO TABS
60.0000 mg | ORAL_TABLET | Freq: Every day | ORAL | 0 refills | Status: DC
Start: 2020-05-27 — End: 2020-09-09

## 2020-05-27 NOTE — Telephone Encounter (Signed)
Lmom that we change to armour thyroid as per dfk

## 2020-05-27 NOTE — Telephone Encounter (Signed)
Pt notified we change med

## 2020-05-27 NOTE — Telephone Encounter (Signed)
lmom to call us back due to pt cannot take levothyroxine gave her

## 2020-06-01 ENCOUNTER — Telehealth: Payer: Self-pay

## 2020-06-01 NOTE — Telephone Encounter (Signed)
LMOM to reschedule Friday afternoon appt 02-18-21

## 2020-06-11 ENCOUNTER — Ambulatory Visit: Payer: Self-pay | Admitting: Physician Assistant

## 2020-06-14 ENCOUNTER — Other Ambulatory Visit: Payer: Self-pay

## 2020-06-14 ENCOUNTER — Encounter: Payer: Self-pay | Admitting: Physician Assistant

## 2020-06-14 ENCOUNTER — Ambulatory Visit (INDEPENDENT_AMBULATORY_CARE_PROVIDER_SITE_OTHER): Payer: 59 | Admitting: Physician Assistant

## 2020-06-14 DIAGNOSIS — N959 Unspecified menopausal and perimenopausal disorder: Secondary | ICD-10-CM | POA: Diagnosis not present

## 2020-06-14 DIAGNOSIS — I10 Essential (primary) hypertension: Secondary | ICD-10-CM | POA: Diagnosis not present

## 2020-06-14 DIAGNOSIS — E039 Hypothyroidism, unspecified: Secondary | ICD-10-CM

## 2020-06-14 DIAGNOSIS — E042 Nontoxic multinodular goiter: Secondary | ICD-10-CM

## 2020-06-14 DIAGNOSIS — E782 Mixed hyperlipidemia: Secondary | ICD-10-CM

## 2020-06-14 DIAGNOSIS — Z9114 Patient's other noncompliance with medication regimen: Secondary | ICD-10-CM

## 2020-06-14 NOTE — Progress Notes (Signed)
[facility] [place], [place] [address]  Internal MEDICINE  Office Visit Note  Patient Name: Rebekah Mclean  [id number]  [id number]  Date of Service: 06/16/2020  Chief Complaint  Patient presents with  . Follow-up    discuss possible menopause,needs referral for endocrinology   . Gastroesophageal Reflux  . Hypertension    HPI Pt is here today for f/u on her recent labs and concerns for menopause. -She had been doing well on synthroid until December when she reports the pills she received had changed color/shape and had concern over manufacturing change. Since that change she began to have symptoms of hot flashes and had a light irregular period. She stopped the "new" synthroid pill and had 2 weeks of feeling normal and her cycle started back. She had her LMP on Feb 1st. She was then changed to amour thyroid a few weeks ago. She reports her eyes feel swollen, but aren't actually swollen. This is tolerable and denies any vision changes. She will stop and let us know if it does start worsening/becomes intolerable. She is still taking it and has not noticed any hot flashes. She does still want to f/u with endocrinology regarding thyroid medication since she does feel this eye pressure on the new med. She had previously been followed by them. -Discussed FSH/LH levels indicate menopause, but pt has been regular with cycle other than the one missed cycle when the synthroid medication pills changed. Discussed that this may mean she will start to experience hot flashes and irregular cycles again due to menopause not the medication changes. She expressed understanding with this.  -HTN: BP elevated in office. BP manual recheck 154/98. She does admit that she does not take Coreg every day, much less twice per day and he did not take it today. She is taking diovan daily. Discussed the importance of adhering to medication regimen and she will take Coreg twice daily as prescribed. She  will also log BP at home.  Current Medication: Outpatient Encounter Medications as of 06/14/2020  Medication Sig  . acyclovir (ZOVIRAX) 400 MG tablet Take 1 tablet (400 mg total) by mouth 5 (five) times daily.  . carvedilol (COREG) 12.5 MG tablet Take 1 tablet (12.5 mg total) by mouth 2 (two) times daily with a meal.  . fluticasone (FLONASE) 50 MCG/ACT nasal spray Place 2 sprays into both nostrils daily.  Marland Kitchen ibuprofen (ADVIL,MOTRIN) 800 MG tablet Take 800 mg by mouth 3 (three) times daily as needed.  . Multiple Vitamins-Minerals (ONE-A-DAY WOMENS PO) Take by mouth.  . rosuvastatin (CRESTOR) 5 MG tablet Take 1 tablet (5 mg total) by mouth daily.  Marland Kitchen thyroid (ARMOUR THYROID) 60 MG tablet Take 1 tablet (60 mg total) by mouth daily before breakfast.  . valsartan-hydrochlorothiazide (DIOVAN-HCT) 160-12.5 MG tablet Take 1 tablet by mouth daily.   No facility-administered encounter medications on file as of 06/14/2020.    Surgical History: History reviewed. No pertinent surgical history.  Medical History: Past Medical History:  Diagnosis Date  . GERD (gastroesophageal reflux disease)   . Hypertension   . Hypothyroidism   . Irregular menses     Family History: Family History  Problem Relation Age of Onset  . Diabetes Mother   . Hypertension Mother   . Diabetes Father   . Hypertension Father   . Breast cancer Neg Hx     Social History   Socioeconomic History  . Marital status: Married    Spouse name: Not on file  .  Number of children: Not on file  . Years of education: Not on file  . Highest education level: Not on file  Occupational History  . Not on file  Tobacco Use  . Smoking status: Never Smoker  . Smokeless tobacco: Never Used  Substance and Sexual Activity  . Alcohol use: No  . Drug use: No  . Sexual activity: Yes    Birth control/protection: None  Other Topics Concern  . Not on file  Social History Narrative  . Not on file   Social Determinants of Health    Financial Resource Strain: Not on file  Food Insecurity: Not on file  Transportation Needs: Not on file  Physical Activity: Not on file  Stress: Not on file  Social Connections: Not on file  Intimate Partner Violence: Not on file      Review of Systems  Constitutional: Negative for chills, fatigue and unexpected weight change.  HENT: Negative for congestion, postnasal drip, rhinorrhea, sneezing and sore throat.   Eyes: Negative for redness and visual disturbance.       Sensation of eye swelling, but not actually swollen, possibly feeling more pressure behind eyes  Respiratory: Negative for cough, chest tightness and shortness of breath.   Cardiovascular: Negative for chest pain and palpitations.  Gastrointestinal: Negative for abdominal pain, constipation, diarrhea, nausea and vomiting.  Endocrine:       Hot flashes in Dec, but improved upon stopping synthroid med  Genitourinary: Positive for menstrual problem. Negative for dysuria and frequency.       Had irregularity in cycle in December, but back to normal cycle in Feb  Musculoskeletal: Negative for arthralgias, back pain, joint swelling and neck pain.  Skin: Negative for rash.  Neurological: Negative.  Negative for tremors, numbness and headaches.  Hematological: Negative for adenopathy. Does not bruise/bleed easily.  Psychiatric/Behavioral: Negative for behavioral problems (Depression), sleep disturbance and suicidal ideas. The patient is not nervous/anxious.     Vital Signs: BP (!) 160/100   Pulse 95   Temp (!) 97.3 F (36.3 C)   Resp 16   Ht 5\' 6"  (1.676 m)   Wt 152 lb 9.6 oz (69.2 kg)   SpO2 99%   BMI 24.63 kg/m    Physical Exam Constitutional:      General: She is not in acute distress.    Appearance: She is well-developed and normal weight. She is not diaphoretic.  HENT:     Head: Normocephalic and atraumatic.     Mouth/Throat:     Pharynx: No oropharyngeal exudate.  Eyes:     Extraocular Movements:  Extraocular movements intact.     Pupils: Pupils are equal, round, and reactive to light.  Neck:     Thyroid: No thyromegaly.     Vascular: No JVD.     Trachea: No tracheal deviation.  Cardiovascular:     Rate and Rhythm: Normal rate and regular rhythm.     Heart sounds: Normal heart sounds. No murmur heard. No friction rub. No gallop.   Pulmonary:     Effort: Pulmonary effort is normal. No respiratory distress.     Breath sounds: No wheezing or rales.  Chest:     Chest wall: No tenderness.  Abdominal:     General: Bowel sounds are normal.     Palpations: Abdomen is soft.  Musculoskeletal:        General: Normal range of motion.     Cervical back: Normal range of motion and neck supple.  Lymphadenopathy:  Cervical: No cervical adenopathy.  Skin:    General: Skin is warm and dry.  Neurological:     Mental Status: She is alert and oriented to person, place, and time.     Cranial Nerves: No cranial nerve deficit.  Psychiatric:        Behavior: Behavior normal.        Thought Content: Thought content normal.        Judgment: Judgment normal.        Assessment/Plan: 1. Unspecified menopausal and perimenopausal disorder FSH/LH indicate she is menopausal. Pt's LMP was Feb 1st. Educated that cycles will likely become increasingly irregular and hot flashes may resume. We will continue to follow.  2. Hypothyroidism, unspecified type Changed to Armour thyroid a few weeks ago, no hot flashes on this but some eye pressure/swelling sensation noted. Pt will continue med for now, but will let us know if this sensation continues or is worsening. She will stop med if any visual changes. She also would like referral to endocrinology which is placed. She had previously been followed by them. - Ambulatory referral to Endocrinology  3. Multinodular goiter Repeat US in April.  4. Essential hypertension Elevated in office. Improved some on recheck but still elevated. Pt has not been  taking medication as prescribed so unable to assess if it is working properly. Pt will take coreg BID as prescribed and continue diovan-hct. She will also log her BP daily. F/u in 2 weeks to recheck.  5.  Nonadherence to medication Discussed the importance of taking medication as prescribed. Pt understands and will do better about taking all her medications.  6. Mixed hyperlipidemia Continue crestor.   General Counseling: Jamieka verbalizes understanding of the findings of todays visit and agrees with plan of treatment. I have discussed any further diagnostic evaluation that may be needed or ordered today. We also reviewed her medications today. she has been encouraged to call the office with any questions or concerns that should arise related to todays visit.  Hypertension Counseling:   The following hypertensive lifestyle modification were recommended and discussed:  1. Limiting alcohol intake to less than 1 oz/day of ethanol:(24 oz of beer or 8 oz of wine or 2 oz of 100-proof whiskey). 2. Take baby ASA 81 mg daily. 3. Importance of regular aerobic exercise and losing weight. 4. Reduce dietary saturated fat and cholesterol intake for overall cardiovascular health. 5. Maintaining adequate dietary potassium, calcium, and magnesium intake. 6. Regular monitoring of the blood pressure. 7. Reduce sodium intake to less than 100 mmol/day (less than 2.3 gm of sodium or less than 6 gm of sodium choride)   Orders Placed This Encounter  Procedures  . Ambulatory referral to Endocrinology    No orders of the defined types were placed in this encounter.   Total time spent:30 Minutes Time spent includes review of chart, medications, test results, and follow up plan with the patient.      Dr Lavera Guise Internal medicine

## 2020-07-01 ENCOUNTER — Ambulatory Visit (INDEPENDENT_AMBULATORY_CARE_PROVIDER_SITE_OTHER): Payer: 59 | Admitting: Physician Assistant

## 2020-07-01 ENCOUNTER — Encounter: Payer: Self-pay | Admitting: Physician Assistant

## 2020-07-01 DIAGNOSIS — I10 Essential (primary) hypertension: Secondary | ICD-10-CM | POA: Diagnosis not present

## 2020-07-01 DIAGNOSIS — N959 Unspecified menopausal and perimenopausal disorder: Secondary | ICD-10-CM

## 2020-07-01 DIAGNOSIS — E042 Nontoxic multinodular goiter: Secondary | ICD-10-CM | POA: Diagnosis not present

## 2020-07-01 DIAGNOSIS — E039 Hypothyroidism, unspecified: Secondary | ICD-10-CM

## 2020-07-01 DIAGNOSIS — E782 Mixed hyperlipidemia: Secondary | ICD-10-CM

## 2020-07-01 NOTE — Progress Notes (Signed)
[facility] [place],  [address]  Internal MEDICINE  Office Visit Note  Patient Name: Rebekah Mclean  [id number]  [id number]  Date of Service: 07/04/2020  Chief Complaint  Patient presents with  . Hypertension    HPI Pt is here for f/u on HTN. -Sees endocrinology on the 16th. Still taking armour thyroid and does not have any more eye pain, but still does not feel right on the medication. Feels tired. Having some headaches too. Denies visual changes. -No hot flashes, but also no cycle since feb 1st still. Reexplained that she may be more irrgular based on LH/FSH showing she is menopausal.  -BP at home 107/64, 127/72. Pt brought home machine in which is very old and was reading 155/107 on her cuff in office. Pt is going to get a new cuff since her current one does not seem very accurate in the readings she is getting. She has been taking her meds as prescribed now. Will continue on current meds and have her monitor with new cuff.  Current Medication: Outpatient Encounter Medications as of 07/01/2020  Medication Sig  . acyclovir (ZOVIRAX) 400 MG tablet Take 1 tablet (400 mg total) by mouth 5 (five) times daily.  . carvedilol (COREG) 12.5 MG tablet Take 1 tablet (12.5 mg total) by mouth 2 (two) times daily with a meal.  . fluticasone (FLONASE) 50 MCG/ACT nasal spray Place 2 sprays into both nostrils daily.  Marland Kitchen ibuprofen (ADVIL,MOTRIN) 800 MG tablet Take 800 mg by mouth 3 (three) times daily as needed.  . Multiple Vitamins-Minerals (ONE-A-DAY WOMENS PO) Take by mouth.  . rosuvastatin (CRESTOR) 5 MG tablet Take 1 tablet (5 mg total) by mouth daily.  Marland Kitchen thyroid (ARMOUR THYROID) 60 MG tablet Take 1 tablet (60 mg total) by mouth daily before breakfast.  . valsartan-hydrochlorothiazide (DIOVAN-HCT) 160-12.5 MG tablet Take 1 tablet by mouth daily.   No facility-administered encounter medications on file as of 07/01/2020.    Surgical History: History reviewed. No  pertinent surgical history.  Medical History: Past Medical History:  Diagnosis Date  . GERD (gastroesophageal reflux disease)   . Hypertension   . Hypothyroidism   . Irregular menses     Family History: Family History  Problem Relation Age of Onset  . Diabetes Mother   . Hypertension Mother   . Diabetes Father   . Hypertension Father   . Breast cancer Neg Hx     Social History   Socioeconomic History  . Marital status: Married    Spouse name: Not on file  . Number of children: Not on file  . Years of education: Not on file  . Highest education level: Not on file  Occupational History  . Not on file  Tobacco Use  . Smoking status: Never Smoker  . Smokeless tobacco: Never Used  Substance and Sexual Activity  . Alcohol use: No  . Drug use: No  . Sexual activity: Yes    Birth control/protection: None  Other Topics Concern  . Not on file  Social History Narrative  . Not on file   Social Determinants of Health   Financial Resource Strain: Not on file  Food Insecurity: Not on file  Transportation Needs: Not on file  Physical Activity: Not on file  Stress: Not on file  Social Connections: Not on file  Intimate Partner Violence: Not on file      Review of Systems  Constitutional: Positive for fatigue. Negative for chills and unexpected weight change.  HENT: Negative for congestion, postnasal drip, rhinorrhea, sneezing and sore throat.   Eyes: Negative for pain, redness and visual disturbance.  Respiratory: Negative for cough, chest tightness and shortness of breath.   Cardiovascular: Negative for chest pain and palpitations.  Gastrointestinal: Negative for abdominal pain, constipation, diarrhea, nausea and vomiting.  Genitourinary: Positive for menstrual problem. Negative for dysuria and frequency.  Musculoskeletal: Negative for arthralgias, back pain, joint swelling and neck pain.  Skin: Negative for rash.  Neurological: Positive for headaches. Negative for  tremors, light-headedness and numbness.  Hematological: Negative for adenopathy. Does not bruise/bleed easily.  Psychiatric/Behavioral: Negative for behavioral problems (Depression), sleep disturbance and suicidal ideas. The patient is not nervous/anxious.     Vital Signs: BP 140/90   Pulse 82   Temp 97.8 F (36.6 C)   Resp 16   Ht 5\' 6"  (1.676 m)   Wt 155 lb (70.3 kg)   SpO2 99%   BMI 25.02 kg/m    Physical Exam Vitals and nursing note reviewed.  Constitutional:      General: She is not in acute distress.    Appearance: She is well-developed and normal weight. She is not diaphoretic.  HENT:     Head: Normocephalic and atraumatic.     Mouth/Throat:     Pharynx: No oropharyngeal exudate.  Eyes:     Pupils: Pupils are equal, round, and reactive to light.  Neck:     Thyroid: No thyromegaly.     Vascular: No JVD.     Trachea: No tracheal deviation.  Cardiovascular:     Rate and Rhythm: Normal rate and regular rhythm.     Heart sounds: Normal heart sounds. No murmur heard. No friction rub. No gallop.   Pulmonary:     Effort: Pulmonary effort is normal. No respiratory distress.     Breath sounds: No wheezing or rales.  Chest:     Chest wall: No tenderness.  Abdominal:     General: Bowel sounds are normal.     Palpations: Abdomen is soft.  Musculoskeletal:        General: Normal range of motion.     Cervical back: Normal range of motion and neck supple.  Lymphadenopathy:     Cervical: No cervical adenopathy.  Skin:    General: Skin is warm and dry.  Neurological:     Mental Status: She is alert and oriented to person, place, and time.     Cranial Nerves: No cranial nerve deficit.  Psychiatric:        Behavior: Behavior normal.        Thought Content: Thought content normal.        Judgment: Judgment normal.        Assessment/Plan: 1. Essential hypertension Continue Coreg BID, and Diovan-HCTZ daily. Obtain new BP cuff and monitor at home.  2.  Hypothyroidism, unspecified type Continue Armour thyroid. Appt with endocrinology on the 16th.  3. Multinodular goiter Due for [place] for monitoring in April. Pt will hold off on scheduling at this time until she sees endocrinology and sees if they plan to monitor this.  4. Unspecified menopausal and perimenopausal disorder Continue to monitor any symptoms.  5. Mixed hyperlipidemia Continue Crestor and improve diet and exercise.   General Counseling: Muranda verbalizes understanding of the findings of todays visit and agrees with plan of treatment. I have discussed any further diagnostic evaluation that may be needed or ordered today. We also reviewed her medications today. she has been encouraged to call the  office with any questions or concerns that should arise related to todays visit.    No orders of the defined types were placed in this encounter.   No orders of the defined types were placed in this encounter.   This patient was seen by Drema Dallas, PA-C in collaboration with Dr. Clayborn Bigness as a part of collaborative care agreement.   Total time spent:30 Minutes Time spent includes review of chart, medications, test results, and follow up plan with the patient.      Dr Lavera Guise Internal medicine

## 2020-07-19 ENCOUNTER — Other Ambulatory Visit: Payer: Self-pay | Admitting: Nurse Practitioner

## 2020-07-20 LAB — LIPID PANEL WITH LDL/HDL RATIO
Cholesterol, Total: 238 mg/dL — ABNORMAL HIGH (ref 100–199)
HDL: 52 mg/dL (ref >39)
LDL Chol Calc (NIH): 135 mg/dL — ABNORMAL HIGH (ref 0–99)
LDL/HDL Ratio: 2.6 ratio (ref 0.0–3.2)
Triglycerides: 287 mg/dL — ABNORMAL HIGH (ref 0–149)
VLDL Cholesterol Cal: 51 mg/dL — ABNORMAL HIGH (ref 5–40)

## 2020-07-20 LAB — COMPREHENSIVE METABOLIC PANEL
ALT: 16 IU/L (ref 0–32)
AST: 16 IU/L (ref 0–40)
Albumin/Globulin Ratio: 1.3 (ref 1.2–2.2)
Albumin: 4.4 g/dL (ref 3.8–4.8)
Alkaline Phosphatase: 85 IU/L (ref 44–121)
BUN/Creatinine Ratio: 13 (ref 9–23)
BUN: 11 mg/dL (ref 6–24)
Bilirubin Total: 0.3 mg/dL (ref 0.0–1.2)
CO2: 23 mmol/L (ref 20–29)
Calcium: 9.8 mg/dL (ref 8.7–10.2)
Chloride: 100 mmol/L (ref 96–106)
Creatinine, Ser: 0.86 mg/dL (ref 0.57–1.00)
Globulin, Total: 3.3 g/dL (ref 1.5–4.5)
Glucose: 172 mg/dL — ABNORMAL HIGH (ref 65–99)
Potassium: 4.2 mmol/L (ref 3.5–5.2)
Sodium: 139 mmol/L (ref 134–144)
Total Protein: 7.7 g/dL (ref 6.0–8.5)
eGFR: 82 mL/min/{1.73_m2} (ref >59)

## 2020-07-20 LAB — TSH: TSH: 34.6 u[IU]/mL — ABNORMAL HIGH (ref 0.450–4.500)

## 2020-07-20 LAB — CBC
Hematocrit: 37.6 % (ref 34.0–46.6)
Hemoglobin: 12.3 g/dL (ref 11.1–15.9)
MCH: 26.2 pg — ABNORMAL LOW (ref 26.6–33.0)
MCHC: 32.7 g/dL (ref 31.5–35.7)
MCV: 80 fL (ref 79–97)
Platelets: 299 10*3/uL (ref 150–450)
RBC: 4.7 x10E6/uL (ref 3.77–5.28)
RDW: 12.2 % (ref 11.7–15.4)
WBC: 8 10*3/uL (ref 3.4–10.8)

## 2020-07-20 LAB — T4, FREE: Free T4: 1.17 ng/dL (ref 0.82–1.77)

## 2020-07-20 LAB — VITAMIN D 25 HYDROXY (VIT D DEFICIENCY, FRACTURES): Vit D, 25-Hydroxy: 23.8 ng/mL — ABNORMAL LOW (ref 30.0–100.0)

## 2020-07-22 ENCOUNTER — Telehealth: Payer: Self-pay | Admitting: Internal Medicine

## 2020-07-22 NOTE — Progress Notes (Signed)
Need to speak with pt

## 2020-07-22 NOTE — Progress Notes (Signed)
Please review and advise.

## 2020-07-23 ENCOUNTER — Telehealth: Payer: Self-pay

## 2020-07-23 NOTE — Telephone Encounter (Signed)
Try call pt voicemail is full

## 2020-07-26 ENCOUNTER — Other Ambulatory Visit: Payer: Self-pay | Admitting: Hospice and Palliative Medicine

## 2020-07-28 ENCOUNTER — Other Ambulatory Visit: Payer: 59

## 2020-07-28 NOTE — Progress Notes (Signed)
Lmom to pt call  regarding labs

## 2020-07-30 ENCOUNTER — Other Ambulatory Visit: Payer: 59

## 2020-08-02 ENCOUNTER — Telehealth: Payer: Self-pay

## 2020-08-02 NOTE — Telephone Encounter (Signed)
lmom to call us back

## 2020-08-02 NOTE — Telephone Encounter (Signed)
Spoke with pt she saw dr Carmela Rima and he put her on synthroid 100 mcg as per dr Humphrey Rolls advised her continue taking med

## 2020-08-03 ENCOUNTER — Other Ambulatory Visit: Payer: Self-pay | Admitting: Hospice and Palliative Medicine

## 2020-08-12 ENCOUNTER — Ambulatory Visit: Payer: 59 | Admitting: Hospice and Palliative Medicine

## 2020-08-13 ENCOUNTER — Ambulatory Visit: Payer: 59 | Admitting: Hospice and Palliative Medicine

## 2020-08-18 ENCOUNTER — Other Ambulatory Visit: Payer: 59

## 2020-08-25 ENCOUNTER — Ambulatory Visit (INDEPENDENT_AMBULATORY_CARE_PROVIDER_SITE_OTHER): Payer: 59 | Admitting: Hospice and Palliative Medicine

## 2020-08-25 ENCOUNTER — Encounter: Payer: Self-pay | Admitting: Hospice and Palliative Medicine

## 2020-08-25 ENCOUNTER — Other Ambulatory Visit: Payer: Self-pay

## 2020-08-25 VITALS — BP 132/78 | HR 84 | Temp 98.2°F | Resp 16 | Ht 66.0 in | Wt 157.6 lb

## 2020-08-25 DIAGNOSIS — E119 Type 2 diabetes mellitus without complications: Secondary | ICD-10-CM

## 2020-08-25 DIAGNOSIS — E039 Hypothyroidism, unspecified: Secondary | ICD-10-CM | POA: Diagnosis not present

## 2020-08-25 DIAGNOSIS — R7301 Impaired fasting glucose: Secondary | ICD-10-CM

## 2020-08-25 DIAGNOSIS — I10 Essential (primary) hypertension: Secondary | ICD-10-CM | POA: Diagnosis not present

## 2020-08-25 LAB — POCT GLYCOSYLATED HEMOGLOBIN (HGB A1C): Hemoglobin A1C: 8.5 % — AB (ref 4.0–5.6)

## 2020-08-25 MED ORDER — ACCU-CHEK SOFTCLIX LANCETS MISC: 12 refills | Status: DC

## 2020-08-25 MED ORDER — DAPAGLIFLOZIN PROPANEDIOL 10 MG PO TABS: 10.0000 mg | ORAL_TABLET | Freq: Every day | ORAL | 0 refills | Status: DC

## 2020-08-25 MED ORDER — ACCU-CHEK GUIDE VI STRP
ORAL_STRIP | 12 refills | Status: AC
Start: 2020-08-25 — End: ?

## 2020-08-25 NOTE — Progress Notes (Signed)
Pt given accu-chek Bs monitor to test BS 3 x a week.  I visually showed pt how to use machine to check her blood sugar and also gave her a diabetic meal planning guide.  Sent rx's for test strip and lancets to pharmacy

## 2020-08-25 NOTE — Progress Notes (Signed)
[facility] [place], [place] [address]  Internal MEDICINE  Office Visit Note  Patient Name: Rebekah Mclean  [id number]  [id number]  Date of Service: 08/26/2020  Chief Complaint  Patient presents with  . Follow-up    A1c check fasting glucose  . Hypertension  . Gastroesophageal Reflux    HPI Patient is here for routine follow-up Followed by endocrinology for hypothyroidism--reports that levels have stabilized and remains well controlled Reviewed recent labs--elevated glucose levels, she was fasting for most recent labs Discussed A1C testing in the instance of having impaired fasting glucose levels for a diagnosis of T2DM Has a fairly strong family of T2DM  A1C 8.5 Two week samples of farxiga 5 mg given Increase dose at next visit if tolerating   Current Medication: Outpatient Encounter Medications as of 08/25/2020  Medication Sig  . Accu-Chek Softclix Lancets lancets Use as instructed to check blood sugars twice a day.  E11.65  . acyclovir (ZOVIRAX) 400 MG tablet Take 1 tablet (400 mg total) by mouth 5 (five) times daily.  . carvedilol (COREG) 12.5 MG tablet Take 1 tablet (12.5 mg total) by mouth 2 (two) times daily with a meal.  . dapagliflozin propanediol (FARXIGA) 10 MG TABS tablet Take 1 tablet (10 mg total) by mouth daily before breakfast.  . fluticasone (FLONASE) 50 MCG/ACT nasal spray Place 2 sprays into both nostrils daily.  Marland Kitchen glucose blood (ACCU-CHEK GUIDE) test strip Use as instructed to check blood sugars twice a day E11.65  . ibuprofen (ADVIL,MOTRIN) 800 MG tablet Take 800 mg by mouth 3 (three) times daily as needed.  Marland Kitchen levothyroxine (SYNTHROID) 100 MCG tablet Take 100 mcg by mouth daily before breakfast. Given by ENDO  . levothyroxine (SYNTHROID) 88 MCG tablet Take 100 mcg by mouth.  . Multiple Vitamins-Minerals (ONE-A-DAY WOMENS PO) Take by mouth.  . rosuvastatin (CRESTOR) 5 MG tablet Take 1 tablet (5 mg total) by mouth daily.  .  valsartan-hydrochlorothiazide (DIOVAN-HCT) 160-12.5 MG tablet Take 1 tablet by mouth daily.  Marland Kitchen thyroid (ARMOUR THYROID) 60 MG tablet Take 1 tablet (60 mg total) by mouth daily before breakfast. (Patient not taking: No sig reported)   No facility-administered encounter medications on file as of 08/25/2020.    Surgical History: History reviewed. No pertinent surgical history.  Medical History: Past Medical History:  Diagnosis Date  . GERD (gastroesophageal reflux disease)   . Hypertension   . Hypothyroidism   . Irregular menses     Family History: Family History  Problem Relation Age of Onset  . Diabetes Mother   . Hypertension Mother   . Diabetes Father   . Hypertension Father   . Breast cancer Neg Hx     Social History   Socioeconomic History  . Marital status: Married    Spouse name: Not on file  . Number of children: Not on file  . Years of education: Not on file  . Highest education level: Not on file  Occupational History  . Not on file  Tobacco Use  . Smoking status: Never Smoker  . Smokeless tobacco: Never Used  Substance and Sexual Activity  . Alcohol use: No  . Drug use: No  . Sexual activity: Yes    Birth control/protection: None  Other Topics Concern  . Not on file  Social History Narrative  . Not on file   Social Determinants of Health   Financial Resource Strain: Not on file  Food Insecurity: Not on file  Transportation Needs: Not  on file  Physical Activity: Not on file  Stress: Not on file  Social Connections: Not on file  Intimate Partner Violence: Not on file      Review of Systems  Constitutional: Negative for chills, diaphoresis and fatigue.  HENT: Negative for ear pain, postnasal drip and sinus pressure.   Eyes: Negative for photophobia, discharge, redness, itching and visual disturbance.  Respiratory: Negative for cough, shortness of breath and wheezing.   Cardiovascular: Negative for chest pain, palpitations and leg swelling.   Gastrointestinal: Negative for abdominal pain, constipation, diarrhea, nausea and vomiting.  Genitourinary: Negative for dysuria and flank pain.  Musculoskeletal: Negative for arthralgias, back pain, gait problem and neck pain.  Skin: Negative for color change.  Allergic/Immunologic: Negative for environmental allergies and food allergies.  Neurological: Negative for dizziness and headaches.  Hematological: Does not bruise/bleed easily.  Psychiatric/Behavioral: Negative for agitation, behavioral problems (depression) and hallucinations.    Vital Signs: BP 132/78   Pulse 84   Temp 98.2 F (36.8 C)   Resp 16   Ht 5\' 6"  (1.676 m)   Wt 157 lb 9.6 oz (71.5 kg)   SpO2 99%   BMI 25.44 kg/m    Physical Exam Vitals reviewed.  Constitutional:      Appearance: Normal appearance. She is normal weight.  Cardiovascular:     Rate and Rhythm: Normal rate and regular rhythm.     Pulses: Normal pulses.     Heart sounds: Normal heart sounds.  Pulmonary:     Effort: Pulmonary effort is normal.     Breath sounds: Normal breath sounds.  Abdominal:     General: Abdomen is flat.     Palpations: Abdomen is soft.  Musculoskeletal:        General: Normal range of motion.     Cervical back: Normal range of motion.  Skin:    General: Skin is warm.  Neurological:     General: No focal deficit present.     Mental Status: She is alert and oriented to person, place, and time. Mental status is at baseline.  Psychiatric:        Mood and Affect: Mood normal.        Behavior: Behavior normal.        Thought Content: Thought content normal.        Judgment: Judgment normal.    Assessment/Plan: 1. New onset type 2 diabetes mellitus (HCC) A1C 8.5 Educated on basic diabetes information Provided with glucometer in office today and trained on machine Samples of Farxiga 5 mg given in office today--take two weeks of samples and then begin script of 10 mg Discussed simple change to start healthy  lifestyle changes--will work on eliminating sugar containing sodas and fruit juices Log for glucose readings given in office today--advised to monitor fasting glucose 3 times per week Will follow up in 2 weeks to review log and assess tolerance to [place] Sample diabetic diets provided today as well as information on carbohydrate counting - dapagliflozin propanediol (FARXIGA) 10 MG TABS tablet; Take 1 tablet (10 mg total) by mouth daily before breakfast.  Dispense: 90 tablet; Refill: 0 - glucose blood (ACCU-CHEK GUIDE) test strip; Use as instructed to check blood sugars twice a day E11.65  Dispense: 100 each; Refill: 12 - Accu-Chek Softclix Lancets lancets; Use as instructed to check blood sugars twice a day.  E11.65  Dispense: 100 each; Refill: 12  2. Impaired fasting glucose A1C 8.5 - POCT HgB A1C  3. Essential hypertension BP  and HR remain well controlled, continue to monitor  4. Hypothyroidism, unspecified type Followed and managed by endocrinology  General Counseling: Yoshiye verbalizes understanding of the findings of todays visit and agrees with plan of treatment. I have discussed any further diagnostic evaluation that may be needed or ordered today. We also reviewed her medications today. she has been encouraged to call the office with any questions or concerns that should arise related to todays visit.    Orders Placed This Encounter  Procedures  . POCT HgB A1C    Meds ordered this encounter  Medications  . dapagliflozin propanediol (FARXIGA) 10 MG TABS tablet    Sig: Take 1 tablet (10 mg total) by mouth daily before breakfast.    Dispense:  90 tablet    Refill:  0  . glucose blood (ACCU-CHEK GUIDE) test strip    Sig: Use as instructed to check blood sugars twice a day E11.65    Dispense:  100 each    Refill:  12  . Accu-Chek Softclix Lancets lancets    Sig: Use as instructed to check blood sugars twice a day.  E11.65    Dispense:  100 each    Refill:  12    Time  spent: 30 Minutes Time spent includes review of chart, medications, test results and follow-up plan with the patient.  This patient was seen by Theodoro Grist AGNP-C in Collaboration with Dr Lavera Guise as a part of collaborative care agreement     Tanna Furry. Morio Widen AGNP-C Internal medicine

## 2020-08-26 ENCOUNTER — Encounter: Payer: Self-pay | Admitting: Hospice and Palliative Medicine

## 2020-09-06 ENCOUNTER — Other Ambulatory Visit: Payer: Self-pay | Admitting: Nurse Practitioner

## 2020-09-06 DIAGNOSIS — J300 Vasomotor rhinitis: Secondary | ICD-10-CM

## 2020-09-09 ENCOUNTER — Encounter: Payer: Self-pay | Admitting: Physician Assistant

## 2020-09-09 ENCOUNTER — Other Ambulatory Visit: Payer: Self-pay

## 2020-09-09 ENCOUNTER — Ambulatory Visit: Payer: 59 | Admitting: Physician Assistant

## 2020-09-09 DIAGNOSIS — E039 Hypothyroidism, unspecified: Secondary | ICD-10-CM | POA: Diagnosis not present

## 2020-09-09 DIAGNOSIS — E119 Type 2 diabetes mellitus without complications: Secondary | ICD-10-CM

## 2020-09-09 DIAGNOSIS — I10 Essential (primary) hypertension: Secondary | ICD-10-CM | POA: Diagnosis not present

## 2020-09-09 MED ORDER — DAPAGLIFLOZIN PROPANEDIOL 10 MG PO TABS: 10.0000 mg | ORAL_TABLET | Freq: Every day | ORAL | 0 refills | Status: DC

## 2020-09-09 NOTE — Progress Notes (Signed)
[facility] [place], [place] [address]  Internal MEDICINE  Office Visit Note  Patient Name: Rebekah Mclean  [id number]  [id number]  Date of Service: 09/13/2020  Chief Complaint  Patient presents with  . Follow-up  . Hypertension  . Gastroesophageal Reflux    HPI Pt is here for 2 week follow up after starting farxiga. -Since starting farxiga 5mg  her BG at home 128-147 fasting. She has cut out sugary drinks and working to improve her diet and discussed what to avoid. She has not yet picked up the script for farxiga10mg , but is going to start on that dose now and continue to monitor. -her hypothyroidism is being followed by endocrinology and her Synthroid is now at 144mcg and feeling better on this. She will continue to follow up with endocrinology for monitoring. -BP at home 120/85 on new cuff and has been doing well. BP initially elevated in office, but improved on recheck. She will continue to monitor closely.  Current Medication: Outpatient Encounter Medications as of 09/09/2020  Medication Sig  . Accu-Chek Softclix Lancets lancets Use as instructed to check blood sugars twice a day.  E11.65  . acyclovir (ZOVIRAX) 400 MG tablet Take 1 tablet (400 mg total) by mouth 5 (five) times daily.  . carvedilol (COREG) 12.5 MG tablet Take 1 tablet (12.5 mg total) by mouth 2 (two) times daily with a meal.  . fluticasone (FLONASE) 50 MCG/ACT nasal spray Place 2 sprays into both nostrils daily.  Marland Kitchen glucose blood (ACCU-CHEK GUIDE) test strip Use as instructed to check blood sugars twice a day E11.65  . ibuprofen (ADVIL,MOTRIN) 800 MG tablet Take 800 mg by mouth 3 (three) times daily as needed.  Marland Kitchen levothyroxine (SYNTHROID) 100 MCG tablet Take 100 mcg by mouth daily before breakfast. Given by ENDO  . levothyroxine (SYNTHROID) 88 MCG tablet Take 100 mcg by mouth.  . Multiple Vitamins-Minerals (ONE-A-DAY WOMENS PO) Take by mouth.  . rosuvastatin (CRESTOR) 5 MG tablet Take 1  tablet (5 mg total) by mouth daily.  . valsartan-hydrochlorothiazide (DIOVAN-HCT) 160-12.5 MG tablet Take 1 tablet by mouth daily.  . [DISCONTINUED] dapagliflozin propanediol (FARXIGA) 10 MG TABS tablet Take 1 tablet (10 mg total) by mouth daily before breakfast.  . [DISCONTINUED] thyroid (ARMOUR THYROID) 60 MG tablet Take 1 tablet (60 mg total) by mouth daily before breakfast. (Patient not taking: No sig reported)   No facility-administered encounter medications on file as of 09/09/2020.    Surgical History: History reviewed. No pertinent surgical history.  Medical History: Past Medical History:  Diagnosis Date  . GERD (gastroesophageal reflux disease)   . Hypertension   . Hypothyroidism   . Irregular menses     Family History: Family History  Problem Relation Age of Onset  . Diabetes Mother   . Hypertension Mother   . Diabetes Father   . Hypertension Father   . Breast cancer Neg Hx     Social History   Socioeconomic History  . Marital status: Married    Spouse name: Not on file  . Number of children: Not on file  . Years of education: Not on file  . Highest education level: Not on file  Occupational History  . Not on file  Tobacco Use  . Smoking status: Never Smoker  . Smokeless tobacco: Never Used  Substance and Sexual Activity  . Alcohol use: No  . Drug use: No  . Sexual activity: Yes    Birth control/protection: None  Other Topics Concern  .  Not on file  Social History Narrative  . Not on file   Social Determinants of Health   Financial Resource Strain: Not on file  Food Insecurity: Not on file  Transportation Needs: Not on file  Physical Activity: Not on file  Stress: Not on file  Social Connections: Not on file  Intimate Partner Violence: Not on file      Review of Systems  Constitutional: Negative for chills, fatigue and unexpected weight change.  HENT: Negative for congestion, postnasal drip, rhinorrhea, sneezing and sore throat.   Eyes:  Negative for redness.  Respiratory: Negative for cough, chest tightness and shortness of breath.   Cardiovascular: Negative for chest pain and palpitations.  Gastrointestinal: Negative for abdominal pain, constipation, diarrhea, nausea and vomiting.  Genitourinary: Negative for dysuria and frequency.  Musculoskeletal: Negative for arthralgias, back pain, joint swelling and neck pain.  Skin: Negative for rash.  Neurological: Negative.  Negative for tremors and numbness.  Hematological: Negative for adenopathy. Does not bruise/bleed easily.  Psychiatric/Behavioral: Negative for behavioral problems (Depression), sleep disturbance and suicidal ideas. The patient is not nervous/anxious.     Vital Signs: BP (!) 154/94   Pulse 95   Temp (!) 97.3 F (36.3 C)   Resp 16   Ht 5\' 6"  (1.676 m)   Wt 150 lb 3.2 oz (68.1 kg)   SpO2 99%   BMI 24.24 kg/m    Physical Exam Vitals and nursing note reviewed.  Constitutional:      General: She is not in acute distress.    Appearance: She is well-developed and normal weight. She is not diaphoretic.  HENT:     Head: Normocephalic and atraumatic.     Mouth/Throat:     Pharynx: No oropharyngeal exudate.  Eyes:     Pupils: Pupils are equal, round, and reactive to light.  Neck:     Thyroid: No thyromegaly.     Vascular: No JVD.     Trachea: No tracheal deviation.  Cardiovascular:     Rate and Rhythm: Normal rate and regular rhythm.     Heart sounds: Normal heart sounds. No murmur heard. No friction rub. No gallop.   Pulmonary:     Effort: Pulmonary effort is normal. No respiratory distress.     Breath sounds: No wheezing or rales.  Chest:     Chest wall: No tenderness.  Abdominal:     General: Bowel sounds are normal.     Palpations: Abdomen is soft.  Musculoskeletal:        General: Normal range of motion.     Cervical back: Normal range of motion and neck supple.  Lymphadenopathy:     Cervical: No cervical adenopathy.  Skin:     General: Skin is warm and dry.  Neurological:     Mental Status: She is alert and oriented to person, place, and time.     Cranial Nerves: No cranial nerve deficit.  Psychiatric:        Behavior: Behavior normal.        Thought Content: Thought content normal.        Judgment: Judgment normal.        Assessment/Plan: 1. New onset type 2 diabetes mellitus ([place]) Start Farxiga 10mg  and continue to monitor BG at home. Working to improve diet and exercise.  2. Essential hypertension Improved on recheck, continue current medications and bring log next visit.  3. Hypothyroidism, unspecified type Followed by endocrinology   General Counseling: Serra verbalizes understanding of the findings  of todays visit and agrees with plan of treatment. I have discussed any further diagnostic evaluation that may be needed or ordered today. We also reviewed her medications today. she has been encouraged to call the office with any questions or concerns that should arise related to todays visit.    No orders of the defined types were placed in this encounter.   No orders of the defined types were placed in this encounter.   This patient was seen by Drema Dallas, PA-C in collaboration with Dr. Clayborn Bigness as a part of collaborative care agreement.   Total time spent:30 Minutes Time spent includes review of chart, medications, test results, and follow up plan with the patient.      Dr Lavera Guise Internal medicine

## 2020-09-13 ENCOUNTER — Other Ambulatory Visit: Payer: Self-pay

## 2020-09-13 DIAGNOSIS — E119 Type 2 diabetes mellitus without complications: Secondary | ICD-10-CM

## 2020-09-14 ENCOUNTER — Other Ambulatory Visit: Payer: Self-pay

## 2020-09-14 ENCOUNTER — Telehealth: Payer: Self-pay

## 2020-09-14 MED ORDER — AZITHROMYCIN 250 MG PO TABS: ORAL_TABLET | ORAL | 0 refills | Status: DC

## 2020-09-14 NOTE — Telephone Encounter (Signed)
Spoke to pt and she advised that the farxiga needed a PA and I advised pt that I placed some samples up front for her until we get results back on PA.

## 2020-09-14 NOTE — Telephone Encounter (Signed)
Pt called c/o HA's, sore throat, chills, body aches started sat and tested herself for covid which was negative.  I informed pt that she may have tested to early and with all her symptoms she should re-test in a day or two again or go have a PCR test done.  Per [facility] I sent in azithromycin 250 mg tabs 1 po daily for 10 days to her pharmacy and if she gets any worse she needs to let us know.

## 2020-09-15 ENCOUNTER — Other Ambulatory Visit: Payer: Self-pay | Admitting: Nurse Practitioner

## 2020-09-15 DIAGNOSIS — I10 Essential (primary) hypertension: Secondary | ICD-10-CM

## 2020-09-15 DIAGNOSIS — B001 Herpesviral vesicular dermatitis: Secondary | ICD-10-CM

## 2020-09-21 ENCOUNTER — Other Ambulatory Visit: Payer: Self-pay | Admitting: Nurse Practitioner

## 2020-09-21 DIAGNOSIS — I10 Essential (primary) hypertension: Secondary | ICD-10-CM

## 2020-09-24 ENCOUNTER — Other Ambulatory Visit: Payer: Self-pay

## 2020-09-24 ENCOUNTER — Other Ambulatory Visit: Payer: Self-pay | Admitting: Nurse Practitioner

## 2020-09-24 DIAGNOSIS — I10 Essential (primary) hypertension: Secondary | ICD-10-CM

## 2020-09-24 MED ORDER — VALSARTAN-HYDROCHLOROTHIAZIDE 160-12.5 MG PO TABS: 1.0000 | ORAL_TABLET | Freq: Every day | ORAL | 3 refills | Status: DC

## 2020-10-04 ENCOUNTER — Other Ambulatory Visit: Payer: Self-pay | Admitting: Nurse Practitioner

## 2020-10-04 DIAGNOSIS — I10 Essential (primary) hypertension: Secondary | ICD-10-CM

## 2020-10-05 ENCOUNTER — Telehealth: Payer: Self-pay

## 2020-10-05 ENCOUNTER — Other Ambulatory Visit: Payer: Self-pay | Admitting: Physician Assistant

## 2020-10-05 ENCOUNTER — Other Ambulatory Visit: Payer: Self-pay | Admitting: Nurse Practitioner

## 2020-10-05 DIAGNOSIS — E119 Type 2 diabetes mellitus without complications: Secondary | ICD-10-CM

## 2020-10-05 DIAGNOSIS — I10 Essential (primary) hypertension: Secondary | ICD-10-CM

## 2020-10-05 NOTE — Telephone Encounter (Signed)
Referral was placed for diabetes care with Dr Marcellus Scott per Ander Purpura

## 2020-10-06 ENCOUNTER — Telehealth: Payer: Self-pay

## 2020-10-06 ENCOUNTER — Other Ambulatory Visit: Payer: Self-pay | Admitting: Nurse Practitioner

## 2020-10-06 DIAGNOSIS — I10 Essential (primary) hypertension: Secondary | ICD-10-CM

## 2020-10-06 NOTE — Telephone Encounter (Signed)
Pt advised occult fecal test  is negative repeat in 1 years

## 2020-10-07 ENCOUNTER — Ambulatory Visit: Payer: 59 | Admitting: Physician Assistant

## 2020-10-07 ENCOUNTER — Other Ambulatory Visit: Payer: Self-pay

## 2020-10-07 DIAGNOSIS — J300 Vasomotor rhinitis: Secondary | ICD-10-CM

## 2020-10-07 DIAGNOSIS — B001 Herpesviral vesicular dermatitis: Secondary | ICD-10-CM

## 2020-10-07 DIAGNOSIS — I10 Essential (primary) hypertension: Secondary | ICD-10-CM

## 2020-10-07 MED ORDER — ACYCLOVIR 400 MG PO TABS: 400.0000 mg | ORAL_TABLET | Freq: Every day | ORAL | 3 refills | Status: DC

## 2020-10-07 MED ORDER — CARVEDILOL 12.5 MG PO TABS: 12.5000 mg | ORAL_TABLET | Freq: Two times a day (BID) | ORAL | 1 refills | Status: DC

## 2020-10-07 MED ORDER — FLUTICASONE PROPIONATE 50 MCG/ACT NA SUSP: 2.0000 | Freq: Every day | NASAL | 5 refills | Status: DC

## 2020-11-01 ENCOUNTER — Telehealth: Payer: Self-pay

## 2020-11-01 ENCOUNTER — Other Ambulatory Visit: Payer: Self-pay | Admitting: Physician Assistant

## 2020-11-01 DIAGNOSIS — E119 Type 2 diabetes mellitus without complications: Secondary | ICD-10-CM

## 2020-11-01 MED ORDER — METFORMIN HCL 500 MG PO TABS: 500.0000 mg | ORAL_TABLET | Freq: Every day | ORAL | 3 refills | Status: DC

## 2020-11-01 NOTE — Telephone Encounter (Signed)
Patient referral faxed to [facility] at [phone]. Rebekah Mclean

## 2020-11-01 NOTE — Telephone Encounter (Signed)
Pt informed that the PA for Farxiga 10 mg was denied.  I sent a message to Lauren and she changed her medication to metformin 500 mg once daily.  I called pt and informed her that a new prescription was sent to her pharmacy.

## 2020-11-01 NOTE — Telephone Encounter (Signed)
I sent metformin for now until she sees endocrinology. Other alternatives also required PA/weren't covered

## 2020-12-03 ENCOUNTER — Ambulatory Visit (INDEPENDENT_AMBULATORY_CARE_PROVIDER_SITE_OTHER): Payer: 59

## 2020-12-03 ENCOUNTER — Other Ambulatory Visit: Payer: Self-pay

## 2020-12-03 ENCOUNTER — Ambulatory Visit
Admission: EM | Admit: 2020-12-03 | Discharge: 2020-12-03 | Disposition: A | Discharge status: Home / Self Care | Payer: 59 | Attending: Emergency Medicine | Admitting: Emergency Medicine

## 2020-12-03 ENCOUNTER — Encounter: Payer: Self-pay | Admitting: Emergency Medicine

## 2020-12-03 DIAGNOSIS — M7052 Other bursitis of knee, left knee: Secondary | ICD-10-CM | POA: Diagnosis not present

## 2020-12-03 DIAGNOSIS — M25562 Pain in left knee: Secondary | ICD-10-CM

## 2020-12-03 DIAGNOSIS — W19XXXA Unspecified fall, initial encounter: Secondary | ICD-10-CM

## 2020-12-03 HISTORY — DX: Type 2 diabetes mellitus with unspecified complications: E11.8

## 2020-12-03 NOTE — ED Provider Notes (Signed)
It is [facility]    CSN: [id number] Arrival date & time: 12/03/20  [id number]      History   Chief Complaint Chief Complaint  Patient presents with   Fall   Knee Injury    HPI Rebekah Mclean is a 50 y.o. female.   HPI  50 year old female here for evaluation of left knee pain.  Patient reports that she was walking in [employer] yesterday when she slipped on some water and landed on her left knee on the tile floor.  She states that she is able to bear weight but she does have swelling and some mild bruising over her kneecap.  She states that the pain was most prominent when she flexed her knee, especially past 90 degrees, but that has improved today.  She denies any numbness or tingling in her foot or ankle and no swelling to the lower leg.  Past Medical History:  Diagnosis Date   Diabetes mellitus type 2, controlled, with complications ([place])    GERD (gastroesophageal reflux disease)    Hypertension    Hypothyroidism    Irregular menses     Patient Active Problem List   Diagnosis Date Noted   Unspecified menopausal and perimenopausal disorder 05/14/2020   Encounter for screening mammogram for malignant neoplasm of breast 03/07/2020   Leukocytosis 04/27/2019   Dysuria 12/20/2018   Cellulitis of right axilla 08/21/2018   Other atopic dermatitis 08/21/2018   Multinodular goiter 03/12/2018   Encounter for general adult medical examination with abnormal findings 03/12/2018   Vitamin D deficiency 01/13/2018   Mixed hyperlipidemia 01/13/2018   Fever blister 01/13/2018   Vasomotor rhinitis 01/13/2018   Essential hypertension 10/02/2014   Hypothyroidism 10/02/2014   Irregular menses 10/02/2014   Left ovarian cyst 10/02/2014   Fibroid uterus 10/02/2014    History reviewed. No pertinent surgical history.  OB History     Gravida  1   Para  1   Term  1   Preterm      AB      Living  1      SAB      IAB      Ectopic      Multiple      Live Births   1            Home Medications    Prior to Admission medications   Medication Sig Start Date End Date Taking? Authorizing Provider  Accu-Chek Softclix Lancets lancets Use as instructed to check blood sugars twice a day.  E11.65 08/25/20   Lavera Guise, MD  carvedilol (COREG) 12.5 MG tablet Take 1 tablet (12.5 mg total) by mouth 2 (two) times daily with a meal. 10/07/20   Lavera Guise, MD  fluticasone [facility]) 50 MCG/ACT nasal spray Place 2 sprays into both nostrils daily. 10/07/20   Lavera Guise, MD  glucose blood (ACCU-CHEK GUIDE) test strip Use as instructed to check blood sugars twice a day E11.65 08/25/20   Lavera Guise, MD  ibuprofen (ADVIL,MOTRIN) 800 MG tablet Take 800 mg by mouth 3 (three) times daily as needed.    [provider]  levothyroxine (SYNTHROID) 100 MCG tablet Take 100 mcg by mouth daily before breakfast. Given by ENDO    [provider]  metFORMIN (GLUCOPHAGE) 500 MG tablet Take 1 tablet (500 mg total) by mouth daily with breakfast. 11/01/20   McDonough, Si Gaul, PA-C  Multiple Vitamins-Minerals (ONE-A-DAY WOMENS PO) Take by mouth.  [provider]  rosuvastatin (CRESTOR) 5 MG tablet Take 1 tablet (5 mg total) by mouth daily. 05/27/20   Lavera Guise, MD  valsartan-hydrochlorothiazide (DIOVAN-HCT) 160-12.5 MG tablet Take 1 tablet by mouth daily. 09/24/20   Lavera Guise, MD    Family History Family History  Problem Relation Age of Onset   Diabetes Mother    Hypertension Mother    Diabetes Father    Hypertension Father    Breast cancer Neg Hx     Social History Social History   Tobacco Use   Smoking status: Never   Smokeless tobacco: Never  Substance Use Topics   Alcohol use: No   Drug use: No     Allergies   Patient has no known allergies.   Review of Systems Review of Systems  Constitutional:  Negative for fever.  Musculoskeletal:  Positive for arthralgias and joint swelling. Negative for myalgias.  Skin:  Positive  for color change. Negative for wound.  Neurological:  Negative for weakness and numbness.  Hematological: Negative.   Psychiatric/Behavioral: Negative.      Physical Exam Triage Vital Signs ED Triage Vitals  Enc Vitals Group     BP 12/03/20 0938 (!) 142/98     Pulse Rate 12/03/20 0938 92     Resp 12/03/20 0938 18     Temp 12/03/20 0938 98.1 F (36.7 C)     Temp src --      SpO2 12/03/20 0938 100 %     Weight --      Height --      Head Circumference --      Peak Flow --      Pain Score 12/03/20 0940 0     Pain Loc --      Pain Edu? --      Excl. in [place]? --    No data found.  Updated Vital Signs BP (!) 142/98   Pulse 92   Temp 98.1 F (36.7 C)   Resp 18   SpO2 100%   Visual Acuity Right Eye Distance:   Left Eye Distance:   Bilateral Distance:    Right Eye Near:   Left Eye Near:    Bilateral Near:     Physical Exam Vitals and nursing note reviewed.  Constitutional:      General: She is not in acute distress.    Appearance: Normal appearance. She is normal weight. She is not ill-appearing.  HENT:     Head: Normocephalic and atraumatic.  Musculoskeletal:        General: Swelling and tenderness present. No deformity or signs of injury. Normal range of motion.  Skin:    General: Skin is warm and dry.     Capillary Refill: Capillary refill takes less than 2 seconds.     Findings: Bruising present.  Neurological:     General: No focal deficit present.     Mental Status: She is alert and oriented to person, place, and time.     Sensory: No sensory deficit.     Motor: No weakness.  Psychiatric:        Mood and Affect: Mood normal.        Behavior: Behavior normal.        Thought Content: Thought content normal.        Judgment: Judgment normal.     UC Treatments / Results  Labs (all labs ordered are listed, but only abnormal results are displayed) Labs Reviewed - No data to  display  EKG   Radiology DG Knee Complete 4 Views Left  Result Date:  12/03/2020 CLINICAL DATA:  Infrapatellar pain after falling. EXAM: LEFT KNEE - COMPLETE 4+ VIEW COMPARISON:  None. FINDINGS: The mineralization and alignment are normal. There is no evidence of acute fracture or dislocation. The joint spaces are preserved. There is no significant joint effusion. Mild infrapatellar soft tissue swelling noted on the lateral view. No evidence of foreign body or soft tissue emphysema. IMPRESSION: Mild nonspecific infrapatellar soft tissue swelling. No acute osseous findings or significant joint effusion. Electronically Signed   By: Richardean Sale M.D.   On: 12/03/2020 10:33    Procedures Procedures (including critical care time)  Medications Ordered in UC Medications - No data to display  Initial Impression / Assessment and Plan / UC Course  I have reviewed the triage vital signs and the nursing notes.  Pertinent labs & imaging results that were available during my care of the patient were reviewed by me and considered in my medical decision making (see chart for details).  Patient is a pleasant, nontoxic-appearing 50 year old female here for evaluation of left knee pain has been present since yesterday after she slipped on some water and fell on a tile floor.  Patient is able to bear weight and has a steady gait.  She denies any numbness or tingling in her foot or ankle.  She mainly complains of pain yesterday with flexion of her knee past 90 degrees but reports that is improved today.  She is here because there is still some residual swelling and some bruising over her kneecap and she wants her knee to be evaluated.  Patient's physical exam reveals a left knee that is in normal anatomical alignment.  There is mild ecchymosis to the distal two thirds of the patella with swelling over the patellar bursa.  Patient has mild tenderness with palpation of the proximal tibia near the tibial tuberosity.  There is no ecchymosis or crepitus noted and there is no edema noted as  well.  Patient is DP and PT pulses in her left ankle are 2+.  Patient has no tenderness with palpation of the joint line, palpation of the patella, palpation of the popliteal fossa, or with varus or valgus stress application.  Patient also has no pain with passive range of motion to full extension and full flexion.  Will obtain radiograph of left knee to rule out bony injury but I suspect patient has a traumatic inflammation of her patellar bursa.  Left knee radiographs independently reviewed and evaluated by me.  Interpretation: No evidence of fracture or dislocation.  No joint effusion or significant soft tissue edema visualized on radiographs.  Awaiting radiology overread.  Radiology interpretation is that there is nonspecific mild infrapatellar soft tissue swelling without acute osseous findings or significant joint effusion.  This is consistent with physical exam and will discharge patient home with diagnosis of traumatic infrapatellar bursitis and treat conservatively with compression, ice, and over-the-counter NSAIDs.   Final Clinical Impressions(s) / UC Diagnoses   Final diagnoses:  Infrapatellar bursitis of left knee     Discharge Instructions      Apply ice to your left knee for 20 minutes at a time 2-3 times a day to help decrease swelling.  Use over-the-counter ibuprofen, maximum of 3 tablets every 6 hours with food, help with pain and inflammation.  Wear a compression sleeve or an Ace bandage to help your body reabsorb the fluid from your inflamed bursa.  Return  for increased pain, swelling, redness, heat, or fever.     ED Prescriptions   None    PDMP not reviewed this encounter.   Margarette Canada, NP 12/03/20 1046

## 2020-12-03 NOTE — Discharge Instructions (Addendum)
Apply ice to your left knee for 20 minutes at a time 2-3 times a day to help decrease swelling.  Use over-the-counter ibuprofen, maximum of 3 tablets every 6 hours with food, help with pain and inflammation.  Wear a compression sleeve or an Ace bandage to help your body reabsorb the fluid from your inflamed bursa.  Return for increased pain, swelling, redness, heat, or fever.

## 2020-12-03 NOTE — ED Triage Notes (Signed)
Pt is present today with knee pain form a fall yesterday at [employer]. Pt states that she does have swelling and bruising.

## 2020-12-08 ENCOUNTER — Ambulatory Visit: Payer: 59 | Admitting: Nurse Practitioner

## 2020-12-08 ENCOUNTER — Other Ambulatory Visit: Payer: Self-pay

## 2020-12-08 ENCOUNTER — Encounter: Payer: Self-pay | Admitting: Nurse Practitioner

## 2020-12-08 VITALS — BP 122/68 | HR 82 | Temp 97.3°F | Resp 16 | Ht 66.0 in | Wt 132.6 lb

## 2020-12-08 DIAGNOSIS — E119 Type 2 diabetes mellitus without complications: Secondary | ICD-10-CM

## 2020-12-08 DIAGNOSIS — E039 Hypothyroidism, unspecified: Secondary | ICD-10-CM | POA: Diagnosis not present

## 2020-12-08 NOTE — Progress Notes (Signed)
[facility] [place], [place] [id number]  Internal MEDICINE  Office Visit Note  Patient Name: Rebekah Mclean  [id number]  [id number]  Date of Service: 12/22/2020  Chief Complaint  Patient presents with   Follow-up   Diabetes    Sees endocrinology    Gastroesophageal Reflux   Hypertension    HPI Makailyn presents for a follow up visit today for hypertension. She was diagnosed with new onset diabetes a few months ago. She has since become established with endocrinology who manages her diabetes. Her A1C in may was 8.5. Her hypothyroidism is also managed by Dr. Truddie Coco.      Current Medication: Outpatient Encounter Medications as of 12/08/2020  Medication Sig   Accu-Chek Softclix Lancets lancets Use as instructed to check blood sugars twice a day.  E11.65   carvedilol (COREG) 12.5 MG tablet Take 1 tablet (12.5 mg total) by mouth 2 (two) times daily with a meal.   fluticasone (FLONASE) 50 MCG/ACT nasal spray Place 2 sprays into both nostrils daily.   glucose blood (ACCU-CHEK GUIDE) test strip Use as instructed to check blood sugars twice a day E11.65   levothyroxine (SYNTHROID) 100 MCG tablet Take 100 mcg by mouth daily before breakfast. Given by ENDO   metFORMIN (GLUCOPHAGE) 500 MG tablet Take 1 tablet (500 mg total) by mouth daily with breakfast.   Multiple Vitamins-Minerals (ONE-A-DAY WOMENS PO) Take by mouth.   rosuvastatin (CRESTOR) 5 MG tablet Take 1 tablet (5 mg total) by mouth daily.   valsartan-hydrochlorothiazide (DIOVAN-HCT) 160-12.5 MG tablet Take 1 tablet by mouth daily.   [DISCONTINUED] ibuprofen (ADVIL,MOTRIN) 800 MG tablet Take 800 mg by mouth 3 (three) times daily as needed. (Patient not taking: Reported on 12/08/2020)   No facility-administered encounter medications on file as of 12/08/2020.    Surgical History: History reviewed. No pertinent surgical history.  Medical History: Past Medical History:  Diagnosis Date   Diabetes mellitus  type 2, controlled, with complications ([place])    GERD (gastroesophageal reflux disease)    Hypertension    Hypothyroidism    Irregular menses     Family History: Family History  Problem Relation Age of Onset   Diabetes Mother    Hypertension Mother    Diabetes Father    Hypertension Father    Breast cancer Neg Hx     Social History   Socioeconomic History   Marital status: Married    Spouse name: Not on file   Number of children: Not on file   Years of education: Not on file   Highest education level: Not on file  Occupational History   Not on file  Tobacco Use   Smoking status: Never   Smokeless tobacco: Never  Substance and Sexual Activity   Alcohol use: No   Drug use: No   Sexual activity: Yes    Birth control/protection: None  Other Topics Concern   Not on file  Social History Narrative   Not on file   Social Determinants of Health   Financial Resource Strain: Not on file  Food Insecurity: Not on file  Transportation Needs: Not on file  Physical Activity: Not on file  Stress: Not on file  Social Connections: Not on file  Intimate Partner Violence: Not on file      Review of Systems  Constitutional:  Negative for chills, fatigue and unexpected weight change.  HENT:  Negative for congestion, rhinorrhea, sneezing and sore throat.   Eyes:  Negative for redness.  Respiratory:  Negative for cough, chest tightness and shortness of breath.   Cardiovascular:  Negative for chest pain and palpitations.  Gastrointestinal:  Negative for abdominal pain, constipation, diarrhea, nausea and vomiting.  Genitourinary:  Negative for dysuria and frequency.  Musculoskeletal:  Negative for arthralgias, back pain, joint swelling and neck pain.  Skin:  Negative for rash.  Neurological: Negative.  Negative for tremors and numbness.  Hematological:  Negative for adenopathy. Does not bruise/bleed easily.  Psychiatric/Behavioral:  Negative for behavioral problems (Depression),  sleep disturbance and suicidal ideas. The patient is not nervous/anxious.    Vital Signs: BP 122/68   Pulse 82   Temp (!) 97.3 F (36.3 C)   Resp 16   Ht '5\' 6"'$  (1.676 m)   Wt 132 lb 9.6 oz (60.1 kg)   SpO2 98%   BMI 21.40 kg/m    Physical Exam Vitals reviewed.  Constitutional:      Appearance: Normal appearance.  HENT:     Head: Normocephalic and atraumatic.  Cardiovascular:     Rate and Rhythm: Normal rate and regular rhythm.  Pulmonary:     Effort: Pulmonary effort is normal. No respiratory distress.  Neurological:     Mental Status: She is alert and oriented to person, place, and time.  Psychiatric:        Mood and Affect: Mood normal.        Behavior: Behavior normal.       Assessment/Plan: 1. New onset type 2 diabetes mellitus ([place]) Managed by endocrinology  2. Acquired hypothyroidism Managed by endocrinology   General Counseling: Bannie verbalizes understanding of the findings of todays visit and agrees with plan of treatment. I have discussed any further diagnostic evaluation that may be needed or ordered today. We also reviewed her medications today. she has been encouraged to call the office with any questions or concerns that should arise related to todays visit.    No orders of the defined types were placed in this encounter.   No orders of the defined types were placed in this encounter.   Return for previously scheduled with Lauren in October.   Total time spent:15 Minutes Time spent includes review of chart, medications, test results, and follow up plan with the patient.   [place] Controlled Substance Database was reviewed by me.  This patient was seen by Jonetta Osgood, FNP-C in collaboration with Dr. Clayborn Bigness as a part of collaborative care agreement.   Sircharles Holzheimer R. Valetta Fuller, MSN, FNP-C Internal medicine

## 2020-12-28 ENCOUNTER — Other Ambulatory Visit: Payer: Self-pay | Admitting: Internal Medicine

## 2020-12-28 DIAGNOSIS — E782 Mixed hyperlipidemia: Secondary | ICD-10-CM

## 2021-01-05 ENCOUNTER — Encounter: Payer: 59 | Attending: Endocrinology | Admitting: *Deleted

## 2021-01-05 ENCOUNTER — Other Ambulatory Visit: Payer: Self-pay

## 2021-01-05 ENCOUNTER — Encounter: Payer: Self-pay | Admitting: *Deleted

## 2021-01-05 VITALS — BP 102/70 | Ht 66.0 in | Wt 124.8 lb

## 2021-01-05 DIAGNOSIS — E118 Type 2 diabetes mellitus with unspecified complications: Secondary | ICD-10-CM | POA: Insufficient documentation

## 2021-01-05 DIAGNOSIS — E119 Type 2 diabetes mellitus without complications: Secondary | ICD-10-CM

## 2021-01-05 NOTE — Patient Instructions (Signed)
Check blood sugars before breakfast and 2 hours after supper  - 3 x week Bring blood sugar records to the next appointment/class  Exercise:  Continue walking  for    15-30  minutes   6  days a week  Eat 3 meals day,   1-2  snacks a day Space meals 4-6 hours apart Include 1 snack between lunch and supper  Make an eye doctor appointment  Call back if you want to attend classes or have an additional appointment with the nurse or dietitian

## 2021-01-06 NOTE — Progress Notes (Signed)
Diabetes Self-Management Education  Visit Type: First/Initial  Appt. Start Time: 1525 Appt. End Time: 1630  01/05/2021  [name], identified by name and date of birth, is a 50 y.o. female with a diagnosis of Diabetes: Type 2.   ASSESSMENT  Blood pressure 102/70, height '5\' 6"'$  (1.676 m), weight 124 lb 12.8 oz (56.6 kg). Body mass index is 20.14 kg/m.   Diabetes Self-Management Education - 01/05/21 1637       Visit Information   Visit Type First/Initial      Initial Visit   Diabetes Type Type 2    Are you currently following a meal plan? No    Are you taking your medications as prescribed? Yes    Date Diagnosed 3 months      Health Coping   How would you rate your overall health? Good      Psychosocial Assessment   Patient Belief/Attitude about Diabetes Motivated to manage diabetes   "I don't like it"   Self-care barriers None    Self-management support Doctor's office    Patient Concerns Nutrition/Meal planning;Glycemic Control;Healthy Lifestyle    Special Needs None    Preferred Learning Style Auditory;Visual;Hands on    [place] in progress    How often do you need to have someone help you when you read instructions, pamphlets, or other written materials from your doctor or pharmacy? 1 - Never    What is the last grade level you completed in school? 12+ college      Pre-Education Assessment   Patient understands the diabetes disease and treatment process. Needs Instruction    Patient understands incorporating nutritional management into lifestyle. Needs Instruction    Patient undertands incorporating physical activity into lifestyle. Needs Review    Patient understands using medications safely. Needs Instruction    Patient understands monitoring blood glucose, interpreting and using results Needs Review    Patient understands prevention, detection, and treatment of acute complications. Needs Instruction    Patient understands prevention,  detection, and treatment of chronic complications. Needs Instruction    Patient understands how to develop strategies to address psychosocial issues. Needs Instruction    Patient understands how to develop strategies to promote health/change behavior. Needs Instruction      Complications   Last HgB A1C per patient/outside source 7.2 %   11/24/2020   How often do you check your blood sugar? 1-2 times/day    Fasting Blood glucose range (mg/dL) 70-129   She reports FBG's 76-108 mg/dL.   Postprandial Blood glucose range (mg/dL) 70-129   She reports pp's 99-109 mg/dL.   Have you had a dilated eye exam in the past 12 months? Yes    Have you had a dental exam in the past 12 months? Yes    Are you checking your feet? No      Dietary Intake   Breakfast boiled or scrambled egg with avocado    Snack (morning) reports no snacks    Lunch grilled chicken salad; grilled chicken and greens; fruit (strawberries, grapes, oranges, blueberries, watermelon)    Dinner pork, chicken, fish; peas, beans, corn, green beans, rice, pasta, broccoli, cauliflower, cabbage, squash, zucchini, salads with lettuce, tomatoes, carrots, cuccumbers    Beverage(s) water, unsweetened tea, diet soda      Exercise   Exercise Type Light (walking / raking leaves)    How many days per week to you exercise? 6    How many minutes per day do you exercise? [place]  Total minutes per week of exercise 132      Patient Education   Previous Diabetes Education No    Disease state  Definition of diabetes, type 1 and 2, and the diagnosis of diabetes;Factors that contribute to the development of diabetes    Nutrition management  Role of diet in the treatment of diabetes and the relationship between the three main macronutrients and blood glucose level;Food label reading, portion sizes and measuring food.;Reviewed blood glucose goals for pre and post meals and how to evaluate the patients' food intake on their blood glucose level.    Physical  activity and exercise  Role of exercise on diabetes management, blood pressure control and cardiac health.    Medications Reviewed patients medication for diabetes, action, purpose, timing of dose and side effects.   Pt reports MD just stopped Metformin and Rybelsus.   Monitoring Purpose and frequency of SMBG.;Taught/discussed recording of test results and interpretation of SMBG.;Identified appropriate SMBG and/or A1C goals.    Chronic complications Relationship between chronic complications and blood glucose control    Psychosocial adjustment Identified and addressed patients feelings and concerns about diabetes      Individualized Goals (developed by patient)   Reducing Risk Other (comment)   improve blood sugars, prevent diabetes complications, lead a healthier lifestyle, become more fit     Outcomes   Expected Outcomes Demonstrated interest in learning. Expect positive outcomes        Individualized Plan for Diabetes Self-Management Training:   Learning Objective:  Patient will have a greater understanding of diabetes self-management. Patient education plan is to attend individual and/or group sessions per assessed needs and concerns.   Plan:   Patient Instructions  Check blood sugars before breakfast and 2 hours after supper  - 3 x week Bring blood sugar records to the next appointment/class  Exercise:  Continue walking  for    15-30  minutes   6  days a week  Eat 3 meals day,   1-2  snacks a day Space meals 4-6 hours apart Include 1 snack between lunch and supper  Make an eye doctor appointment  Call back if you want to attend classes or have an additional appointment with the nurse or dietitian  Expected Outcomes:  Demonstrated interest in learning. Expect positive outcomes  Education material provided:  General Meal Planning Guidelines Simple Meal Plan  If problems or questions, patient to contact team via:   Rebekah Drilling, RN, [place], [place] [phone]  Future  DSME appointment:  The patient needs to check with her insurance plan regarding classes or another appointment with this nurse or dietitian.

## 2021-02-11 ENCOUNTER — Encounter: Payer: Self-pay | Admitting: *Deleted

## 2021-02-18 ENCOUNTER — Encounter: Payer: 59 | Admitting: Physician Assistant

## 2021-02-18 ENCOUNTER — Other Ambulatory Visit: Payer: Self-pay

## 2021-02-18 ENCOUNTER — Other Ambulatory Visit: Payer: Self-pay | Admitting: Nurse Practitioner

## 2021-02-18 DIAGNOSIS — I10 Essential (primary) hypertension: Secondary | ICD-10-CM

## 2021-02-18 MED ORDER — VALSARTAN-HYDROCHLOROTHIAZIDE 160-12.5 MG PO TABS: 1.0000 | ORAL_TABLET | Freq: Every day | ORAL | 3 refills | Status: DC

## 2021-03-02 ENCOUNTER — Telehealth: Payer: Self-pay

## 2021-03-02 NOTE — Telephone Encounter (Signed)
Left vm to confirm 03/04/21 appointment-Toni

## 2021-03-04 ENCOUNTER — Telehealth: Payer: Self-pay

## 2021-03-04 ENCOUNTER — Ambulatory Visit (INDEPENDENT_AMBULATORY_CARE_PROVIDER_SITE_OTHER): Payer: Self-pay | Admitting: Physician Assistant

## 2021-03-04 ENCOUNTER — Encounter: Payer: Self-pay | Admitting: Physician Assistant

## 2021-03-04 ENCOUNTER — Other Ambulatory Visit: Payer: Self-pay

## 2021-03-04 DIAGNOSIS — R300 Dysuria: Secondary | ICD-10-CM

## 2021-03-04 DIAGNOSIS — E039 Hypothyroidism, unspecified: Secondary | ICD-10-CM

## 2021-03-04 DIAGNOSIS — Z01419 Encounter for gynecological examination (general) (routine) without abnormal findings: Secondary | ICD-10-CM

## 2021-03-04 DIAGNOSIS — Z8742 Personal history of other diseases of the female genital tract: Secondary | ICD-10-CM

## 2021-03-04 DIAGNOSIS — I10 Essential (primary) hypertension: Secondary | ICD-10-CM

## 2021-03-04 DIAGNOSIS — E119 Type 2 diabetes mellitus without complications: Secondary | ICD-10-CM

## 2021-03-04 DIAGNOSIS — Z0001 Encounter for general adult medical examination with abnormal findings: Secondary | ICD-10-CM

## 2021-03-04 DIAGNOSIS — E782 Mixed hyperlipidemia: Secondary | ICD-10-CM

## 2021-03-04 MED ORDER — TETANUS-DIPHTH-ACELL PERTUSSIS 5-2.5-18.5 LF-MCG/0.5 IM SUSY: 0.5000 mL | PREFILLED_SYRINGE | Freq: Once | INTRAMUSCULAR | 0 refills | Status: AC

## 2021-03-04 NOTE — Progress Notes (Signed)
[facility] [place], [place] [address]  Internal MEDICINE  Office Visit Note  Patient Name: Rebekah Mclean  [id number]  [id number]  Date of Service: 03/04/2021  Chief Complaint  Patient presents with   Annual Exam   Diabetes    Pt is seen by Dr Bary Leriche for diabetes   Hypertension     HPI Pt is here for routine health maintenance examination -She is seeing endocrinology for diabetes and is on onlgyza 5mg . Also followed for hypothyroid and is on synthroid of 36mcg. Doing well on these. She sees him again next week for labs. -BP  at home 527P systolic -Has seen obgyn in the pastdue to hx of abnormal uterine bleeding with fibroids discovered. She also had an abnormal pap in 2021 with OBGYN and has not had a repeat. Though these findings were low risk will go ahead and refer back to obgyn for re-evaluation. Reports no more issues with uterine bleeding since that time. -mammogram due in feb, UTD on colon cancer screening.  -She has paperwork for job clearance and will need TB skin test with PPD placement. Since it is Friday will have her set up a nurse visit to place this on Monday and have her come back for reading 48-72 hours after placement. Pt aware of cost. -She will also need to update her tdap and will need to locate vaccination records. -Sleeping well and eating healthy appetite -walking regularly  Current Medication: Outpatient Encounter Medications as of 03/04/2021  Medication Sig   Accu-Chek Softclix Lancets lancets Use as instructed to check blood sugars twice a day.  E11.65   carvedilol (COREG) 12.5 MG tablet Take 1 tablet (12.5 mg total) by mouth 2 (two) times daily with a meal.   fluticasone (FLONASE) 50 MCG/ACT nasal spray Place 2 sprays into both nostrils daily. (Patient taking differently: Place 2 sprays into both nostrils daily as needed.)   glucose blood (ACCU-CHEK GUIDE) test strip Use as instructed to check blood sugars twice a day  E11.65   levothyroxine (SYNTHROID) 88 MCG tablet Take 88 mcg by mouth daily before breakfast.   rosuvastatin (CRESTOR) 5 MG tablet Take 1 tablet by mouth once daily   valsartan-hydrochlorothiazide (DIOVAN-HCT) 160-12.5 MG tablet Take 1 tablet by mouth daily.   [DISCONTINUED] Tdap (BOOSTRIX) 5-2.5-18.5 LF-MCG/0.5 injection Inject 0.5 mLs into the muscle once.   Tdap (BOOSTRIX) 5-2.5-18.5 LF-MCG/0.5 injection Inject 0.5 mLs into the muscle once for 1 dose.   [DISCONTINUED] levothyroxine (SYNTHROID) 100 MCG tablet Take 100 mcg by mouth daily before breakfast. Given by ENDO (Patient not taking: Reported on 03/04/2021)   [DISCONTINUED] Multiple Vitamins-Minerals (ONE-A-DAY WOMENS PO) Take by mouth. (Patient not taking: No sig reported)   No facility-administered encounter medications on file as of 03/04/2021.    Surgical History: History reviewed. No pertinent surgical history.  Medical History: Past Medical History:  Diagnosis Date   Diabetes mellitus type 2, controlled, with complications ([place])    GERD (gastroesophageal reflux disease)    Hypertension    Hypothyroidism    Irregular menses     Family History: Family History  Problem Relation Age of Onset   Diabetes Mother    Hypertension Mother    Heart disease Father    Hypertension Father    Diabetes Sister    Breast cancer Neg Hx       Review of Systems  Constitutional:  Negative for chills, fatigue and unexpected weight change.  HENT:  Negative for congestion, rhinorrhea, sneezing and  sore throat.   Eyes:  Negative for redness.  Respiratory:  Negative for cough, chest tightness and shortness of breath.   Cardiovascular:  Negative for chest pain and palpitations.  Gastrointestinal:  Negative for abdominal pain, constipation, diarrhea, nausea and vomiting.  Genitourinary:  Negative for dysuria and frequency.  Musculoskeletal:  Negative for arthralgias, back pain, joint swelling and neck pain.  Skin:  Negative for rash.   Neurological: Negative.  Negative for tremors and numbness.  Hematological:  Negative for adenopathy. Does not bruise/bleed easily.  Psychiatric/Behavioral:  Negative for behavioral problems (Depression), sleep disturbance and suicidal ideas. The patient is not nervous/anxious.     Vital Signs: BP 134/76   Pulse 92   Temp 98.2 F (36.8 C)   Resp 16   Ht 5\' 6"  (1.676 m)   Wt 127 lb 6.4 oz (57.8 kg)   SpO2 99%   BMI 20.56 kg/m    Physical Exam Vitals and nursing note reviewed.  Constitutional:      General: She is not in acute distress.    Appearance: She is well-developed and normal weight. She is not diaphoretic.  HENT:     Head: Normocephalic and atraumatic.     Right Ear: External ear normal.     Left Ear: External ear normal.     Nose: Nose normal.     Mouth/Throat:     Pharynx: No oropharyngeal exudate.  Eyes:     General: No scleral icterus.       Right eye: No discharge.        Left eye: No discharge.     Conjunctiva/sclera: Conjunctivae normal.     Pupils: Pupils are equal, round, and reactive to light.  Neck:     Thyroid: No thyromegaly.     Vascular: No JVD.     Trachea: No tracheal deviation.  Cardiovascular:     Rate and Rhythm: Normal rate and regular rhythm.     Heart sounds: Normal heart sounds. No murmur heard.   No friction rub. No gallop.  Pulmonary:     Effort: Pulmonary effort is normal. No respiratory distress.     Breath sounds: Normal breath sounds. No stridor. No wheezing or rales.  Chest:     Chest wall: No tenderness.  Breasts:    Right: Normal. No mass.     Left: Normal. No mass.  Abdominal:     General: Bowel sounds are normal. There is no distension.     Palpations: Abdomen is soft. There is no mass.     Tenderness: There is no abdominal tenderness. There is no guarding or rebound.  Musculoskeletal:        General: No tenderness or deformity. Normal range of motion.     Cervical back: Normal range of motion and neck supple.   Lymphadenopathy:     Cervical: No cervical adenopathy.  Skin:    General: Skin is warm and dry.     Coloration: Skin is not pale.     Findings: No erythema or rash.  Neurological:     Mental Status: She is alert.     Cranial Nerves: No cranial nerve deficit.     Motor: No abnormal muscle tone.     Coordination: Coordination normal.     Deep Tendon Reflexes: Reflexes are normal and symmetric.  Psychiatric:        Behavior: Behavior normal.        Thought Content: Thought content normal.  Judgment: Judgment normal.     LABS: No results found for this or any previous visit (from the past 2160 hour(s)).      Assessment/Plan: 1. Encounter for general adult medical examination with abnormal findings CPE performed, patient is up-to-date on labs and actually has further labs through endocrinology next week.  She will be due for mammogram in February and is up-to-date on colon cancer screening.  Will be referred back to OB/GYN for follow-up Pap due to history of abnormal Pap in 2021  2. Essential hypertension Stable, continue current medication  3. Acquired hypothyroidism Stable, followed by endocrinology  4. New onset type 2 diabetes mellitus ([place]) Followed by endocrinology  5. Hx of abnormal cervical Pap smear Abnormal Pap in January 2021 at the same time as some other abnormal findings on pelvic ultrasound.  Will refer back to OB/GYN for repeat Pap and any further evaluation and management - Ambulatory referral to Obstetrics / Gynecology  6. Visit for gynecologic examination Breast exam performed today  7. Mixed hyperlipidemia Continue crestor  8.Dysuria - UA/M w/rflx Culture, Routine   General Counseling: Teliyah verbalizes understanding of the findings of todays visit and agrees with plan of treatment. I have discussed any further diagnostic evaluation that may be needed or ordered today. We also reviewed her medications today. she has been encouraged to call  the office with any questions or concerns that should arise related to todays visit.    Counseling:    Orders Placed This Encounter  Procedures   UA/M w/rflx Culture, Routine   Ambulatory referral to Obstetrics / Gynecology    Meds ordered this encounter  Medications   Tdap (BOOSTRIX) 5-2.5-18.5 LF-MCG/0.5 injection    Sig: Inject 0.5 mLs into the muscle once for 1 dose.    Dispense:  0.5 mL    Refill:  0    This patient was seen by Drema Dallas, PA-C in collaboration with Dr. Clayborn Bigness as a part of collaborative care agreement.  Total time spent:35 Minutes  Time spent includes review of chart, medications, test results, and follow up plan with the patient.     Lavera Guise, MD  Internal Medicine

## 2021-03-04 NOTE — Telephone Encounter (Signed)
Obgyn referral sent via [facility]

## 2021-03-05 LAB — MICROSCOPIC EXAMINATION
Bacteria, UA: NONE SEEN
Casts: NONE SEEN /lpf
Epithelial Cells (non renal): NONE SEEN /hpf (ref 0–10)
RBC, Urine: NONE SEEN /hpf (ref 0–2)
WBC, UA: NONE SEEN /hpf (ref 0–5)

## 2021-03-05 LAB — UA/M W/RFLX CULTURE, ROUTINE
Bilirubin, UA: NEGATIVE
Glucose, UA: NEGATIVE
Ketones, UA: NEGATIVE
Leukocytes,UA: NEGATIVE
Nitrite, UA: NEGATIVE
Protein,UA: NEGATIVE
RBC, UA: NEGATIVE
Specific Gravity, UA: 1.019 (ref 1.005–1.030)
Urobilinogen, Ur: 0.2 mg/dL (ref 0.2–1.0)
pH, UA: 6.5 (ref 5.0–7.5)

## 2021-03-05 IMAGING — MG DIGITAL SCREENING BILAT W/ TOMO W/ CAD
8 series · 9 of 24 positions shown · non-contrast
Comparison: Previous exam(s).

CLINICAL DATA: Screening.

EXAM:
DIGITAL SCREENING BILATERAL MAMMOGRAM WITH TOMO AND CAD

[R MLO synth-2D]
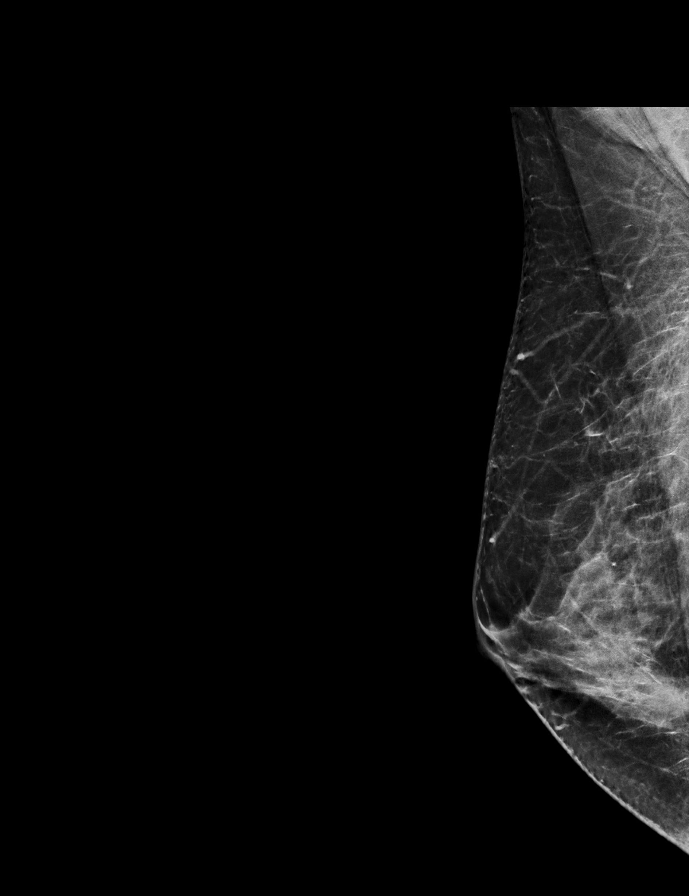

[R CC synth-2D]
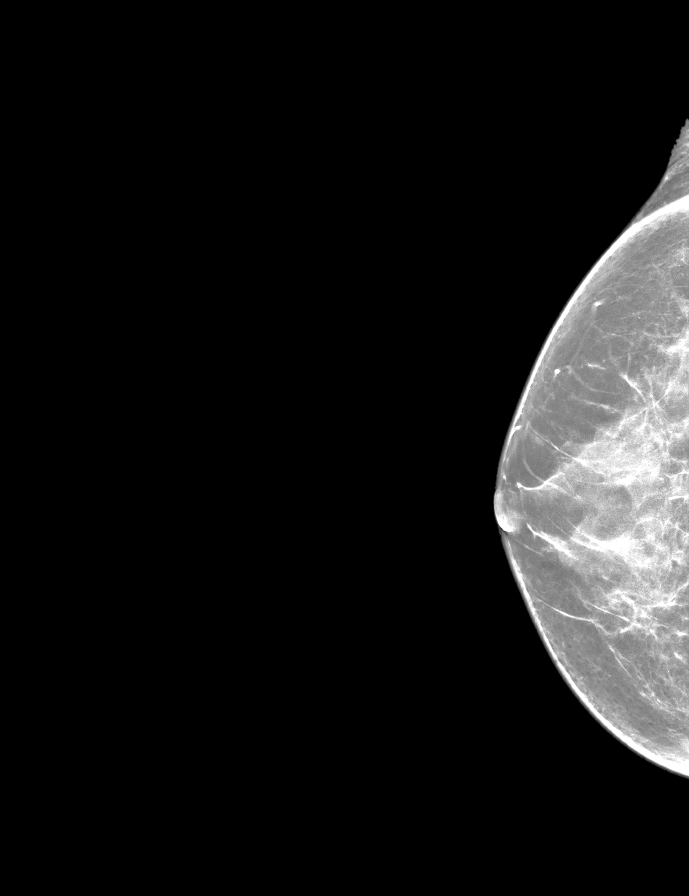

[L CC synth-2D]
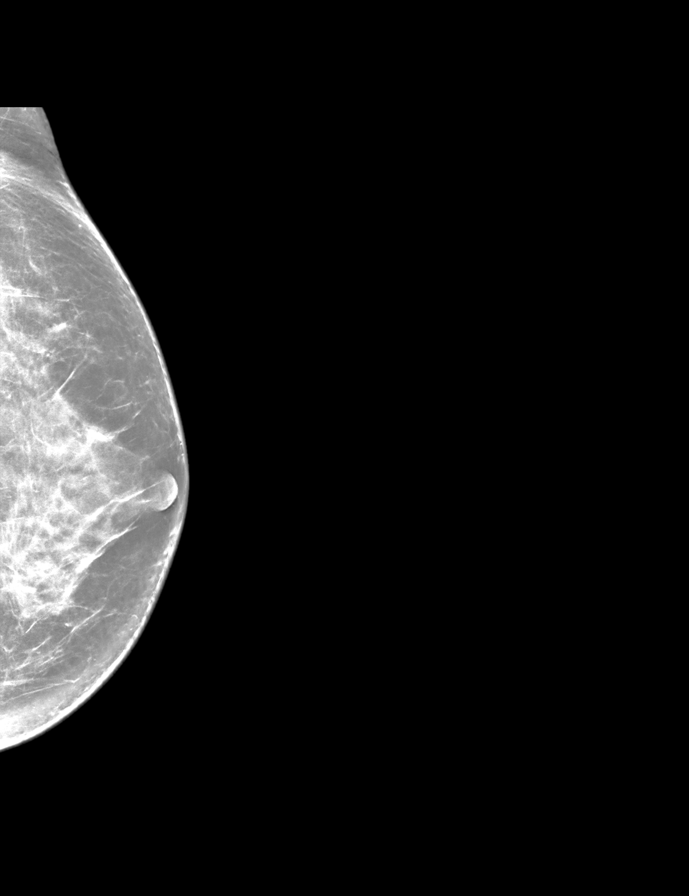

[L MLO synth-2D]
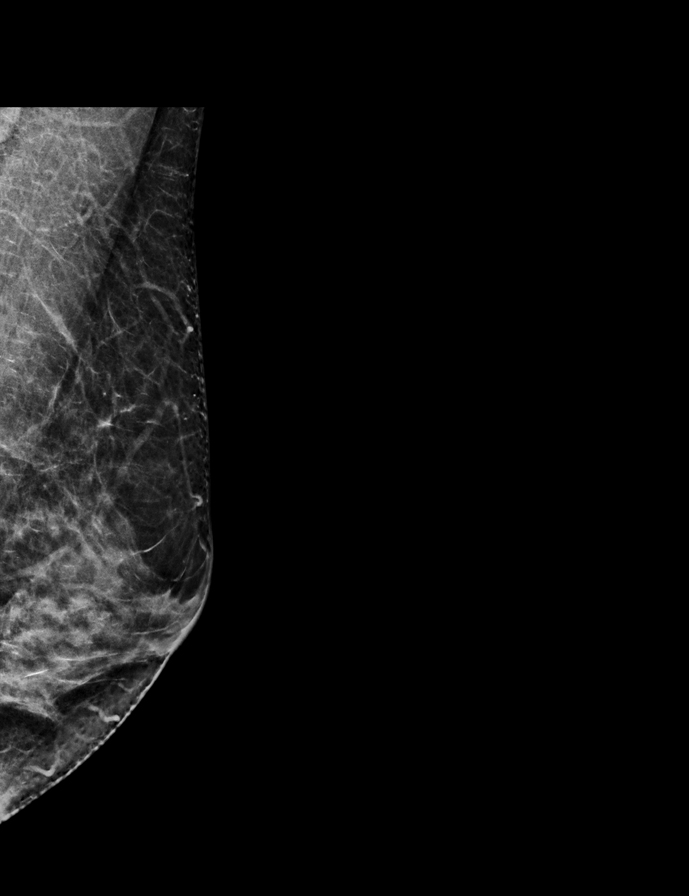

[R MLO tomo · 2 of 65 frames shown]
[frame 21/65]
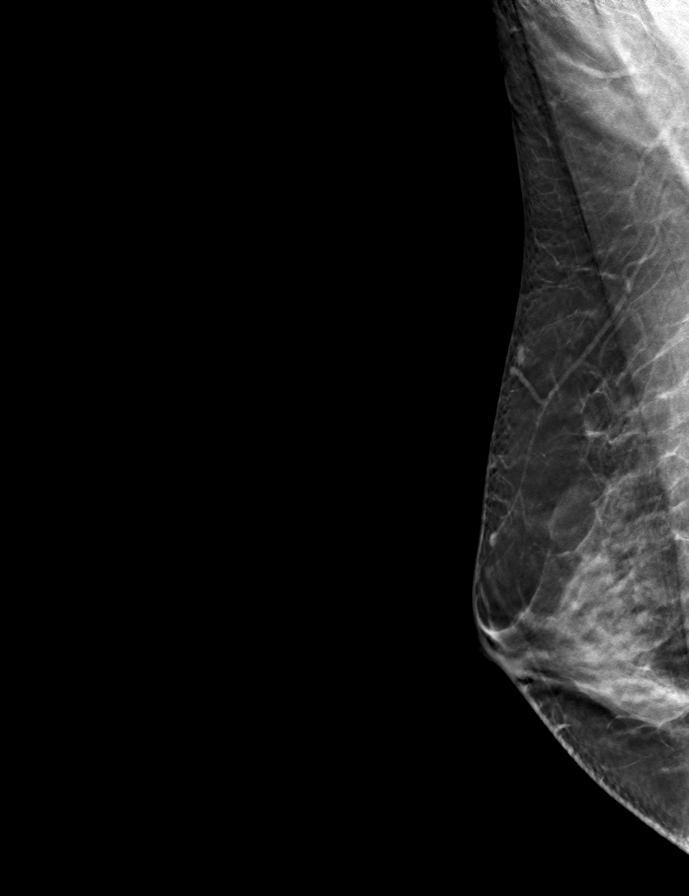
[frame 33/65]
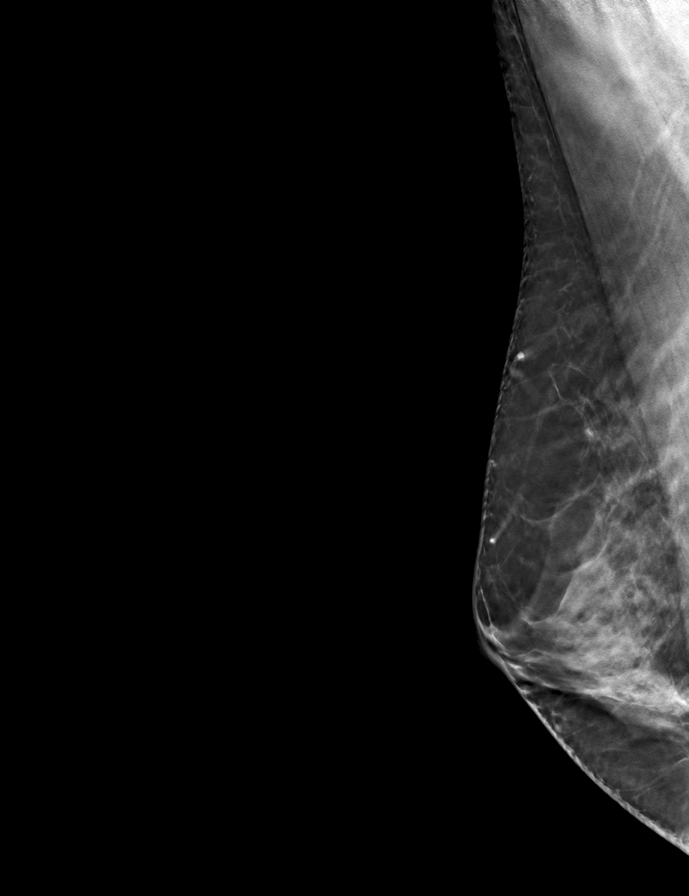

[L MLO tomo · tomo slice 31/60.0]
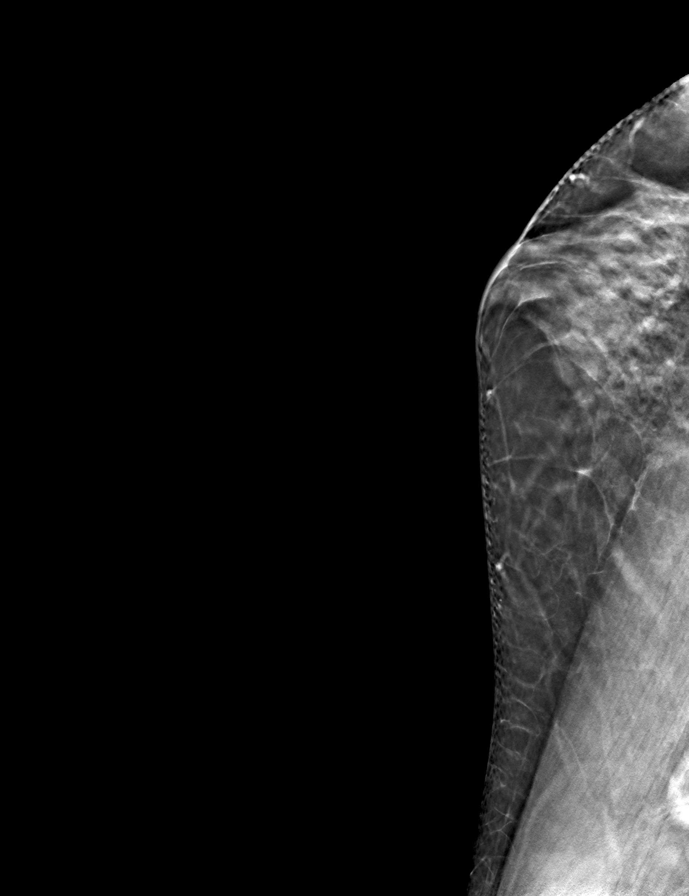

[R CC tomo · tomo slice 31/60.0]
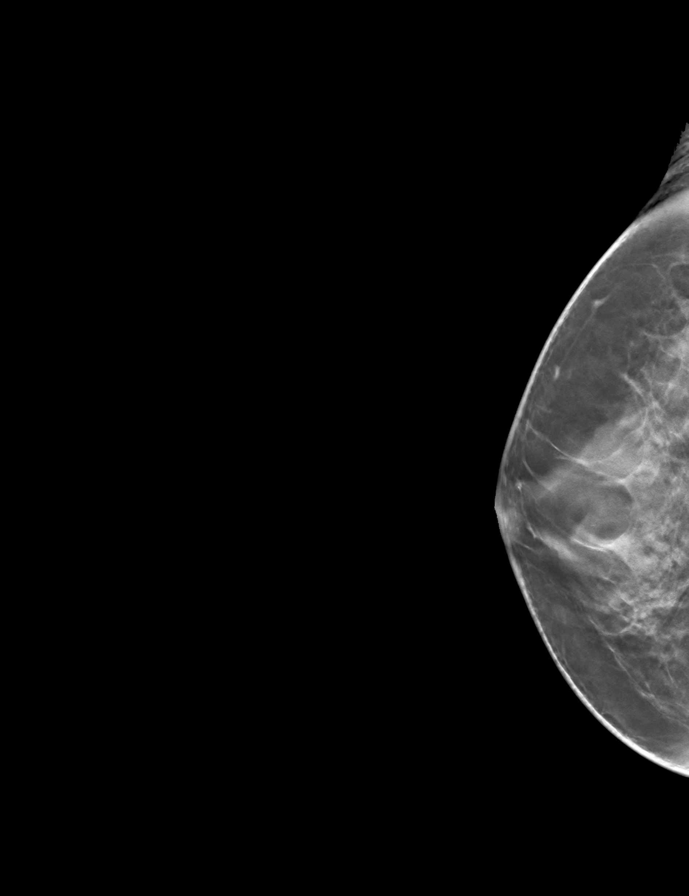

[L CC tomo · tomo slice 33/64.0]
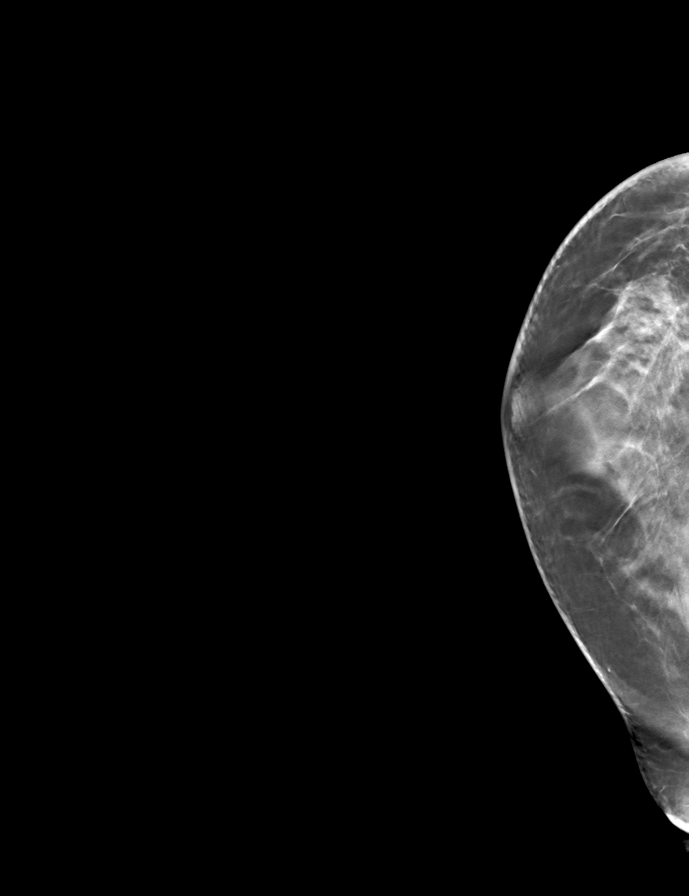

[9 of 24 positions shown; findings below may reference images not displayed]

ACR Breast Density Category c: The breast tissue is heterogeneously
dense, which may obscure small masses.
FINDINGS: There are no findings suspicious for malignancy. Images were
processed with CAD.
IMPRESSION: No mammographic evidence of malignancy. A result letter of this
screening mammogram will be mailed directly to the patient.

RECOMMENDATION:
Screening mammogram in one year. (Code:[id number]-U-LHB)

BI-RADS CATEGORY  1: Negative.

## 2021-03-07 ENCOUNTER — Other Ambulatory Visit: Payer: Self-pay | Admitting: Physician Assistant

## 2021-03-07 ENCOUNTER — Other Ambulatory Visit: Payer: Self-pay

## 2021-03-07 ENCOUNTER — Ambulatory Visit: Payer: Self-pay

## 2021-03-07 DIAGNOSIS — Z23 Encounter for immunization: Secondary | ICD-10-CM

## 2021-03-07 DIAGNOSIS — Z111 Encounter for screening for respiratory tuberculosis: Secondary | ICD-10-CM

## 2021-03-07 DIAGNOSIS — Z008 Encounter for other general examination: Secondary | ICD-10-CM

## 2021-03-08 NOTE — Telephone Encounter (Signed)
Referral redirected to [facility] via [facility]

## 2021-03-09 ENCOUNTER — Telehealth: Payer: Self-pay

## 2021-03-09 LAB — TB SKIN TEST
Induration: 0 mm
TB Skin Test: NEGATIVE

## 2021-03-09 NOTE — Telephone Encounter (Signed)
[facility] referring for Hx of abnormal cervical Pap smear. MD only Called and left voicemail for patient to call back to be scheduled. x2

## 2021-03-10 ENCOUNTER — Telehealth: Payer: Self-pay

## 2021-03-10 NOTE — Telephone Encounter (Signed)
Called and left voicemail for patient to call back to be scheduled. 

## 2021-03-10 NOTE — Telephone Encounter (Signed)
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

## 2021-03-10 NOTE — Telephone Encounter (Signed)
Copy of new hire paperwork for health examination placed in scan.

## 2021-03-11 NOTE — Telephone Encounter (Signed)
Pt is scheduled with AMS on Dec. 20 at 2:30.

## 2021-03-11 NOTE — Telephone Encounter (Signed)
Called and left voicemail for patient to call back to be scheduled.  Contacting referring provider due to multiple attempts to reach patient were unsuccessful

## 2021-03-28 ENCOUNTER — Other Ambulatory Visit: Payer: Self-pay

## 2021-03-28 DIAGNOSIS — I10 Essential (primary) hypertension: Secondary | ICD-10-CM

## 2021-03-28 MED ORDER — VALSARTAN-HYDROCHLOROTHIAZIDE 160-12.5 MG PO TABS: 1.0000 | ORAL_TABLET | Freq: Every day | ORAL | 3 refills | Status: DC

## 2021-04-07 ENCOUNTER — Telehealth: Payer: Self-pay

## 2021-04-07 NOTE — Telephone Encounter (Signed)
Pt called requesting lab results she had done on 03/25/21.  I had to call [facility] to get results faxed to [place].

## 2021-04-12 ENCOUNTER — Encounter: Payer: Self-pay | Admitting: Obstetrics and Gynecology

## 2021-04-12 ENCOUNTER — Telehealth: Payer: Self-pay

## 2021-04-12 ENCOUNTER — Other Ambulatory Visit: Payer: Self-pay

## 2021-04-12 ENCOUNTER — Ambulatory Visit: Payer: BC Managed Care – PPO | Admitting: Obstetrics and Gynecology

## 2021-04-12 VITALS — BP 136/78 | Ht 66.0 in | Wt 127.0 lb

## 2021-04-12 DIAGNOSIS — R87610 Atypical squamous cells of undetermined significance on cytologic smear of cervix (ASC-US): Secondary | ICD-10-CM | POA: Diagnosis not present

## 2021-04-12 NOTE — Progress Notes (Signed)
Obstetrics & Gynecology Office Visit   Chief Complaint:  Chief Complaint  Patient presents with   Gynecologic Exam    Referral for abnormal pap - RM 4    History of Present Illness:Rebekah Mclean is a 50 y.o. woman who presents today for continued surveillance for history of dysplasia. Last pap obtained on 05/15/2019 revealed ASCUS HPV negative.    Pap/Treatment History:  07/10/95 NILM 01/11/95 NILM 06/28/84 NILM Treated with TCA 12/22/93 LGSIL 12/22/93 Colposcopy ASCUS/LGSIL pap CIN I      Review of Systems: Review of Systems  Constitutional: Negative.   Gastrointestinal: Negative.   Genitourinary: Negative.     Past Medical History:  Patient Active Problem List   Diagnosis Date Noted   Unspecified menopausal and perimenopausal disorder 05/14/2020   Encounter for screening mammogram for malignant neoplasm of breast 03/07/2020   Leukocytosis 04/27/2019   Dysuria 12/20/2018   Cellulitis of right axilla 08/21/2018   Other atopic dermatitis 08/21/2018   Multinodular goiter 03/12/2018   Encounter for general adult medical examination with abnormal findings 03/12/2018   Vitamin D deficiency 01/13/2018   Mixed hyperlipidemia 01/13/2018   Fever blister 01/13/2018   Vasomotor rhinitis 01/13/2018   Essential hypertension 10/02/2014   Hypothyroidism 10/02/2014   Irregular menses 10/02/2014   Left ovarian cyst 10/02/2014   Fibroid uterus 10/02/2014    Past Surgical History:  Patient Active Problem List   Diagnosis Date Noted   Unspecified menopausal and perimenopausal disorder 05/14/2020   Encounter for screening mammogram for malignant neoplasm of breast 03/07/2020   Leukocytosis 04/27/2019   Dysuria 12/20/2018   Cellulitis of right axilla 08/21/2018   Other atopic dermatitis 08/21/2018   Multinodular goiter 03/12/2018   Encounter for general adult medical examination with abnormal findings 03/12/2018   Vitamin D deficiency 01/13/2018   Mixed hyperlipidemia  01/13/2018   Fever blister 01/13/2018   Vasomotor rhinitis 01/13/2018   Essential hypertension 10/02/2014   Hypothyroidism 10/02/2014   Irregular menses 10/02/2014   Left ovarian cyst 10/02/2014   Fibroid uterus 10/02/2014    Gynecologic History: Patient's last menstrual period was 02/26/2021.  Obstetric History: G1P1001  Family History:  Family History  Problem Relation Age of Onset   Diabetes Mother    Hypertension Mother    Heart disease Father    Hypertension Father    Diabetes Sister    Breast cancer Neg Hx     Social History:  Social History   Socioeconomic History   Marital status: Married    Spouse name: Not on file   Number of children: Not on file   Years of education: Not on file   Highest education level: Not on file  Occupational History   Not on file  Tobacco Use   Smoking status: Never   Smokeless tobacco: Never  Substance and Sexual Activity   Alcohol use: No   Drug use: No   Sexual activity: Yes    Birth control/protection: None  Other Topics Concern   Not on file  Social History Narrative   Not on file   Social Determinants of Health   Financial Resource Strain: Not on file  Food Insecurity: Not on file  Transportation Needs: Not on file  Physical Activity: Not on file  Stress: Not on file  Social Connections: Not on file  Intimate Partner Violence: Not on file    Allergies:  No Known Allergies  Medications: Prior to Admission medications   Medication Sig Start Date End Date  Taking? Authorizing Provider  carvedilol (COREG) 12.5 MG tablet Take 1 tablet (12.5 mg total) by mouth 2 (two) times daily with a meal. 10/07/20  Yes Lavera Guise, MD  levothyroxine (SYNTHROID) 88 MCG tablet Take 88 mcg by mouth daily before breakfast.   Yes [provider]  rosuvastatin (CRESTOR) 5 MG tablet Take 1 tablet by mouth once daily 12/29/20  Yes Lavera Guise, MD  valsartan-hydrochlorothiazide (DIOVAN-HCT) 160-12.5 MG tablet Take 1 tablet by  mouth daily. 03/28/21  Yes McDonough, Lauren K, PA-C  Accu-Chek Softclix Lancets lancets Use as instructed to check blood sugars twice a day.  E11.65 08/25/20   Lavera Guise, MD  acyclovir (ZOVIRAX) 400 MG tablet Take 400 mg by mouth 5 (five) times daily. 03/30/21   [provider]  fluticasone (FLONASE) 50 MCG/ACT nasal spray Place 2 sprays into both nostrils daily. Patient taking differently: Place 2 sprays into both nostrils daily as needed. 10/07/20   Lavera Guise, MD  glucose blood (ACCU-CHEK GUIDE) test strip Use as instructed to check blood sugars twice a day E11.65 08/25/20   Lavera Guise, MD  ONGLYZA 5 MG TABS tablet Take 5 mg by mouth daily. 02/22/21   [provider]    Physical Exam Vitals:  Vitals:   04/12/21 1443  BP: 136/78   Patient's last menstrual period was 02/26/2021.  General: NAD HEENT: normocephalic, anicteric Pulmonary: No increased work of breathingpathy Extremities: no edema, erythema, or tenderness Neurologic: Grossly intact Psychiatric: mood appropriate, affect full  Female chaperone present for pelvic and breast  portions of the physical exam  Assessment: 50 y.o. G1P1001 follow up for ASCUS HPV negative pap  Plan: Problem List Items Addressed This Visit   None Visit Diagnoses     ASCUS of cervix with negative high risk HPV    -  Primary       - Follow up pap smear in 2 years 04/2022  - I had a lengthly discussion with Rebekah Mclean  regarding the cause of dysplasia of the lower genital tract (including immunosuppression in the setting of HPV exposure and tobacco exposure). I explained the potential for progression to invasive malignancy, the recurrent nature of these lesions (and the need for close continued followup). Results of today's pap will dictate need for further evaluation and follow up per ASCCP guidelines..  - She is comfortable with the plan and had her questions answered.  - Return in about 2 years (around 04/13/2023)  for Repeat pap.   Malachy Mood, MD, [facility], [facility] 04/12/2021, 3:04 PM

## 2021-04-12 NOTE — Telephone Encounter (Signed)
Pt called again to ask for lab results and I called [facility] again and they advised that the vaccine titers were not drawn, only the C-peptide, CMP and A1C.  I spoke to Linton Flemings [phone] and I informed pt.  She is coming by the office to pick up another lab order to have the titers drawn

## 2021-04-13 NOTE — Telephone Encounter (Signed)
error 

## 2021-04-14 LAB — MEASLES/MUMPS/RUBELLA IMMUNITY
MUMPS ABS, IGG: 89.7 AU/mL (ref >10.9)
RUBEOLA AB, IGG: 171 AU/mL (ref >16.4)
Rubella Antibodies, IGG: 3.25 index (ref >0.99)

## 2021-04-14 LAB — HEPATITIS B SURFACE ANTIBODY,QUALITATIVE: Hep B Surface Ab, Qual: NONREACTIVE

## 2021-04-19 ENCOUNTER — Other Ambulatory Visit: Payer: Self-pay | Admitting: Physician Assistant

## 2021-04-20 ENCOUNTER — Telehealth: Payer: Self-pay

## 2021-04-20 NOTE — Telephone Encounter (Signed)
-----  Message from Mylinda Latina, PA-C sent at 04/19/2021  4:25 PM EST ----- Please let her know that her MMR immunizations did show immunity, however her hep B titer did not show immunity at this time. If she is working in a position where proof of immunity is needed or is in a high risk environment I would definitely recommend she receive a hep B booster at the pharmacy or health dept

## 2021-04-20 NOTE — Telephone Encounter (Signed)
Spoke to Rebekah Mclean and informed her of lab results and that Lauren recommends Rebekah Mclean to have Hep B Booster.  Rebekah Mclean understood

## 2021-05-02 ENCOUNTER — Other Ambulatory Visit: Payer: Self-pay | Admitting: Physician Assistant

## 2021-05-02 DIAGNOSIS — Z1231 Encounter for screening mammogram for malignant neoplasm of breast: Secondary | ICD-10-CM

## 2021-06-14 ENCOUNTER — Other Ambulatory Visit: Payer: Self-pay

## 2021-06-14 ENCOUNTER — Ambulatory Visit (INDEPENDENT_AMBULATORY_CARE_PROVIDER_SITE_OTHER): Payer: BC Managed Care – PPO | Admitting: Nurse Practitioner

## 2021-06-14 ENCOUNTER — Ambulatory Visit
Admission: RE | Admit: 2021-06-14 | Discharge: 2021-06-14 | Disposition: A | Discharge status: Home / Self Care | Payer: BC Managed Care – PPO | Source: Ambulatory Visit | Attending: Physician Assistant | Admitting: Physician Assistant

## 2021-06-14 ENCOUNTER — Encounter: Payer: Self-pay | Admitting: Nurse Practitioner

## 2021-06-14 VITALS — BP 136/90 | HR 87 | Temp 98.3°F | Resp 16 | Ht 66.0 in | Wt 125.2 lb

## 2021-06-14 DIAGNOSIS — R051 Acute cough: Secondary | ICD-10-CM

## 2021-06-14 DIAGNOSIS — J029 Acute pharyngitis, unspecified: Secondary | ICD-10-CM

## 2021-06-14 DIAGNOSIS — Z1231 Encounter for screening mammogram for malignant neoplasm of breast: Secondary | ICD-10-CM | POA: Diagnosis not present

## 2021-06-14 DIAGNOSIS — J0110 Acute frontal sinusitis, unspecified: Secondary | ICD-10-CM | POA: Diagnosis not present

## 2021-06-14 LAB — POCT RAPID STREP A (OFFICE): Rapid Strep A Screen: NEGATIVE

## 2021-06-14 MED ORDER — AMOXICILLIN-POT CLAVULANATE 875-125 MG PO TABS: 1.0000 | ORAL_TABLET | Freq: Two times a day (BID) | ORAL | 0 refills | Status: AC

## 2021-06-14 NOTE — Progress Notes (Signed)
[facility] [place], [place] [address]  Internal MEDICINE  Office Visit Note  Patient Name: Rebekah Mclean  [id number]  [id number]  Date of Service: 06/14/2021  Chief Complaint  Patient presents with   Acute Visit    Neg covid test this morning, congestion   Sinusitis   Sore Throat   Cough     HPI Chasitty presents for an acute sick visit for symptoms of sinusitis. She reports that her symptoms started more than a week ago. She was negative for covid at home and negative for strep in office today. She denies any flu like symptoms. She reports having sore throat, cough, headache, nasal congestion, runny nose and sinus pressure. She denies any fever, chills, fatigue, body aches nausea or vomiting.     Current Medication:  Outpatient Encounter Medications as of 06/14/2021  Medication Sig   Accu-Chek Softclix Lancets lancets Use as instructed to check blood sugars twice a day.  E11.65   acyclovir (ZOVIRAX) 400 MG tablet Take 400 mg by mouth 5 (five) times daily.   amoxicillin-clavulanate (AUGMENTIN) 875-125 MG tablet Take 1 tablet by mouth 2 (two) times daily for 10 days. Take with food.   carvedilol (COREG) 12.5 MG tablet Take 1 tablet (12.5 mg total) by mouth 2 (two) times daily with a meal.   fluticasone (FLONASE) 50 MCG/ACT nasal spray Place 2 sprays into both nostrils daily. (Patient taking differently: Place 2 sprays into both nostrils daily as needed.)   glucose blood (ACCU-CHEK GUIDE) test strip Use as instructed to check blood sugars twice a day E11.65   levothyroxine (SYNTHROID) 88 MCG tablet Take 88 mcg by mouth daily before breakfast.   ONGLYZA 5 MG TABS tablet Take 5 mg by mouth daily.   rosuvastatin (CRESTOR) 5 MG tablet Take 1 tablet by mouth once daily   valsartan-hydrochlorothiazide (DIOVAN-HCT) 160-12.5 MG tablet Take 1 tablet by mouth daily.   No facility-administered encounter medications on file as of 06/14/2021.      Medical  History: Past Medical History:  Diagnosis Date   Diabetes mellitus type 2, controlled, with complications ([place])    GERD (gastroesophageal reflux disease)    Hypertension    Hypothyroidism    Irregular menses      Vital Signs: BP 136/90    Pulse 87    Temp 98.3 F (36.8 C)    Resp 16    Ht 5\' 6"  (1.676 m)    Wt 125 lb 3.2 oz (56.8 kg)    SpO2 99%    BMI 20.21 kg/m    Review of Systems  Constitutional:  Negative for chills, fatigue and fever.  HENT:  Positive for congestion, postnasal drip, rhinorrhea, sinus pressure, sinus pain, sneezing and sore throat. Negative for ear pain and trouble swallowing.   Respiratory:  Positive for cough. Negative for chest tightness, shortness of breath and wheezing.   Cardiovascular: Negative.  Negative for chest pain and palpitations.  Gastrointestinal:  Negative for diarrhea, nausea and vomiting.  Skin:  Negative for rash.  Neurological:  Positive for headaches.   Physical Exam Vitals reviewed.  Constitutional:      General: She is not in acute distress.    Appearance: Normal appearance. She is not ill-appearing.  HENT:     Head: Normocephalic and atraumatic.     Right Ear: Tympanic membrane, ear canal and external ear normal.     Left Ear: Ear canal and external ear normal. A middle ear effusion is present.  Nose: Congestion and rhinorrhea present.     Mouth/Throat:     Mouth: Mucous membranes are moist.     Pharynx: Posterior oropharyngeal erythema present.     Tonsils: 0 on the right. 0 on the left.  Eyes:     Pupils: Pupils are equal, round, and reactive to light.  Cardiovascular:     Rate and Rhythm: Normal rate and regular rhythm.     Heart sounds: Normal heart sounds.  Pulmonary:     Effort: Pulmonary effort is normal. No respiratory distress.     Breath sounds: Normal breath sounds. No wheezing or rhonchi.  Lymphadenopathy:     Cervical: Cervical adenopathy present.  Neurological:     Mental Status: She is alert and  oriented to person, place, and time.  Psychiatric:        Mood and Affect: Mood normal.        Behavior: Behavior normal.      Assessment/Plan: 1. Acute non-recurrent frontal sinusitis Empiric antibiotic treatment prescribed.  - amoxicillin-clavulanate (AUGMENTIN) 875-125 MG tablet; Take 1 tablet by mouth 2 (two) times daily for 10 days. Take with food.  Dispense: 20 tablet; Refill: 0  2. Sore throat Negative for strep - POCT rapid strep A  3. Acute cough May use OTC mucinex-D and instructed on packaging    General Counseling: Matison verbalizes understanding of the findings of todays visit and agrees with plan of treatment. I have discussed any further diagnostic evaluation that may be needed or ordered today. We also reviewed her medications today. she has been encouraged to call the office with any questions or concerns that should arise related to todays visit.    Counseling:    Orders Placed This Encounter  Procedures   POCT rapid strep A    Meds ordered this encounter  Medications   amoxicillin-clavulanate (AUGMENTIN) 875-125 MG tablet    Sig: Take 1 tablet by mouth 2 (two) times daily for 10 days. Take with food.    Dispense:  20 tablet    Refill:  0    Return if symptoms worsen or fail to improve.  [place] Controlled Substance Database was reviewed by me for overdose risk score (ORS)  Time spent:30 Minutes Time spent with patient included reviewing progress notes, labs, imaging studies, and discussing plan for follow up.   This patient was seen by Jonetta Osgood, FNP-C in collaboration with Dr. Clayborn Bigness as a part of collaborative care agreement.  Yoshiko Keleher R. Valetta Fuller, MSN, FNP-C Internal Medicine

## 2021-06-27 ENCOUNTER — Other Ambulatory Visit: Payer: Self-pay | Admitting: Internal Medicine

## 2021-06-27 DIAGNOSIS — I10 Essential (primary) hypertension: Secondary | ICD-10-CM

## 2021-07-14 ENCOUNTER — Telehealth: Payer: Self-pay

## 2021-07-14 ENCOUNTER — Other Ambulatory Visit: Payer: Self-pay

## 2021-07-14 MED ORDER — AMOXICILLIN 500 MG PO TABS: 500.0000 mg | ORAL_TABLET | Freq: Two times a day (BID) | ORAL | 0 refills | Status: DC

## 2021-07-14 NOTE — Telephone Encounter (Signed)
Pt called that she having sore throat and fever covid test is negative unable to come in and she was exposure to strep test as per lauren send amoxicillin for 7 day take with food  ?

## 2021-07-14 NOTE — Telephone Encounter (Signed)
Lmom to call us back and we can send antibiotics  ?

## 2021-07-14 NOTE — Telephone Encounter (Signed)
Pt called  that she having sore throat and fever covid test is negative and she is exposure to strep test  offer her today appt she cannot come due to she working advised her to go to urgent care  ?

## 2021-07-31 ENCOUNTER — Other Ambulatory Visit: Payer: Self-pay | Admitting: Internal Medicine

## 2021-07-31 DIAGNOSIS — E782 Mixed hyperlipidemia: Secondary | ICD-10-CM

## 2021-09-01 ENCOUNTER — Ambulatory Visit: Payer: BC Managed Care – PPO | Admitting: Physician Assistant

## 2021-09-11 ENCOUNTER — Ambulatory Visit
Admission: EM | Admit: 2021-09-11 | Discharge: 2021-09-11 | Disposition: A | Discharge status: Home / Self Care | Payer: BC Managed Care – PPO

## 2021-09-11 ENCOUNTER — Other Ambulatory Visit: Payer: Self-pay

## 2021-09-11 DIAGNOSIS — M7711 Lateral epicondylitis, right elbow: Secondary | ICD-10-CM | POA: Diagnosis not present

## 2021-09-11 MED ORDER — PREDNISONE 10 MG (21) PO TBPK: ORAL_TABLET | ORAL | 0 refills | Status: DC

## 2021-09-11 NOTE — ED Provider Notes (Signed)
[facility]    CSN: [id number] Arrival date & time: 09/11/21  1502      History   Chief Complaint Chief Complaint  Patient presents with   Arm Pain    HPI Rebekah Mclean is a 51 y.o. female.   HPI  51 year old female here for evaluation of orthopedic complaint.  Patient is here for evaluation of pain in the outside of her right elbow that has been ongoing for last 3 and half weeks.  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  She states that the pain initially started in the outside of her right elbow and then spread into the muscles of her forearm and will occasionally go down into the fingers of her hand.  She denies any falls or injuries.  She is not having numbness or tingling in her fingers.  Past Medical History:  Diagnosis Date   Diabetes mellitus type 2, controlled, with complications ([place])    GERD (gastroesophageal reflux disease)    Hypertension    Hypothyroidism    Irregular menses     Patient Active Problem List   Diagnosis Date Noted   Unspecified menopausal and perimenopausal disorder 05/14/2020   Encounter for screening mammogram for malignant neoplasm of breast 03/07/2020   Leukocytosis 04/27/2019   Dysuria 12/20/2018   Cellulitis of right axilla 08/21/2018   Other atopic dermatitis 08/21/2018   Multinodular goiter 03/12/2018   Encounter for general adult medical examination with abnormal findings 03/12/2018   Vitamin D deficiency 01/13/2018   Mixed hyperlipidemia 01/13/2018   Fever blister 01/13/2018   Vasomotor rhinitis 01/13/2018   Essential hypertension 10/02/2014   Hypothyroidism 10/02/2014   Irregular menses 10/02/2014   Left ovarian cyst 10/02/2014   Fibroid uterus 10/02/2014    History reviewed. No pertinent surgical history.  OB History     Gravida  1   Para  1   Term  1   Preterm      AB       Living  1      SAB      IAB      Ectopic      Multiple      Live Births  1            Home Medications    Prior to Admission medications   Medication Sig Start Date End Date Taking? Authorizing Provider  Accu-Chek Softclix Lancets lancets Use as instructed to check blood sugars twice a day.  E11.65 08/25/20  Yes Lavera Guise, MD  acyclovir (ZOVIRAX) 400 MG tablet Take 400 mg by mouth 5 (five) times daily. 03/30/21  Yes [provider]  carvedilol (COREG) 12.5 MG tablet TAKE 1 TABLET BY MOUTH TWICE DAILY WITH A MEAL 06/27/21  Yes McDonough, Lauren K, PA-C  fluticasone (FLONASE) 50 MCG/ACT nasal spray Place 2 sprays into both nostrils daily. Patient taking differently: Place 2 sprays into both nostrils daily as needed. 10/07/20  Yes Lavera Guise, MD  glimepiride (AMARYL) 2 MG tablet Take 2 mg by mouth every morning. 07/11/21  Yes [provider]  glucose blood (ACCU-CHEK GUIDE) test strip Use as instructed to check blood sugars twice a day E11.65 08/25/20  Yes Lavera Guise, MD  levothyroxine (SYNTHROID) 88 MCG tablet Take 88 mcg by mouth daily before breakfast.   Yes [provider]  ONGLYZA 5 MG  TABS tablet Take 5 mg by mouth daily. 02/22/21  Yes [provider]  predniSONE (STERAPRED UNI-PAK 21 TAB) 10 MG (21) TBPK tablet Take 6 tablets on day 1, 5 tablets day 2, 4 tablets day 3, 3 tablets day 4, 2 tablets day 5, 1 tablet day 6 09/11/21  Yes Margarette Canada, NP  rosuvastatin (CRESTOR) 5 MG tablet Take 1 tablet by mouth once daily 07/31/21  Yes McDonough, Lauren K, PA-C  valsartan-hydrochlorothiazide (DIOVAN-HCT) 160-12.5 MG tablet Take 1 tablet by mouth daily. 03/28/21  Yes McDonough, Si Gaul, PA-C    Family History Family History  Problem Relation Age of Onset   Diabetes Mother    Hypertension Mother    Heart disease Father    Hypertension Father    Diabetes Sister    Breast cancer Neg Hx     Social History Social History   Tobacco Use    Smoking status: Never   Smokeless tobacco: Never  Vaping Use   Vaping Use: Never used  Substance Use Topics   Alcohol use: No   Drug use: No     Allergies   Patient has no known allergies.   Review of Systems Review of Systems  Constitutional:  Negative for fever.  Musculoskeletal:  Positive for arthralgias and myalgias. Negative for joint swelling.  Skin:  Negative for color change.  Neurological:  Negative for weakness and numbness.  Psychiatric/Behavioral: Negative.      Physical Exam Triage Vital Signs ED Triage Vitals  Enc Vitals Group     BP 09/11/21 1511 (!) 126/92     Pulse Rate 09/11/21 1511 79     Resp 09/11/21 1511 18     Temp 09/11/21 1511 98.2 F (36.8 C)     Temp Source 09/11/21 1511 Oral     SpO2 09/11/21 1511 100 %     Weight 09/11/21 1508 125 lb (56.7 kg)     Height 09/11/21 1508 '5\' 6"'$  (1.676 m)     Head Circumference --      Peak Flow --      Pain Score 09/11/21 1506 5     Pain Loc --      Pain Edu? --      Excl. in [place]? --    No data found.  Updated Vital Signs BP (!) 126/92 (BP Location: Left Arm)   Pulse 79   Temp 98.2 F (36.8 C) (Oral)   Resp 18   Ht '5\' 6"'$  (1.676 m)   Wt 125 lb (56.7 kg)   LMP  (LMP Unknown)   SpO2 100%   BMI 20.18 kg/m   Visual Acuity Right Eye Distance:   Left Eye Distance:   Bilateral Distance:    Right Eye Near:   Left Eye Near:    Bilateral Near:     Physical Exam Vitals and nursing note reviewed.  Constitutional:      Appearance: Normal appearance. She is not ill-appearing.  HENT:     Head: Normocephalic and atraumatic.  Musculoskeletal:        General: Tenderness present. No swelling, deformity or signs of injury. Normal range of motion.  Skin:    General: Skin is warm and dry.     Capillary Refill: Capillary refill takes less than 2 seconds.     Findings: No bruising or erythema.  Neurological:     General: No focal deficit present.     Mental Status: She is alert and oriented to person,  place, and time.  Psychiatric:        Mood and Affect: Mood normal.        Behavior: Behavior normal.        Thought Content: Thought content normal.        Judgment: Judgment normal.     UC Treatments / Results  Labs (all labs ordered are listed, but only abnormal results are displayed) Labs Reviewed - No data to display  EKG   Radiology No results found.  Procedures Procedures (including critical care time)  Medications Ordered in UC Medications - No data to display  Initial Impression / Assessment and Plan / UC Course  I have reviewed the triage vital signs and the nursing notes.  Pertinent labs & imaging results that were available during my care of the patient were reviewed by me and considered in my medical decision making (see chart for details).  Patient is a nontoxic-appearing 51 year old female here for evaluation of right nipple pain that has been going on for less than 8 weeks as outlined in HPI above.  On exam patient's right forearm and elbow are in normal anatomical alignment.  She has 5 or 5 grip strength in her right hand.  No pain with passive range of motion of her right wrist or right elbow.  She does have tenderness when palpating the lateral epicondyles of her right humerus as well as palpation of the muscle groups just inferior to her lateral epicondyle.  Her radial ulnar pulses are 2+.  Patient's exam is consistent with lateral epicondylitis.  I will place her on prednisone to start tomorrow morning.  She has been using over-the-counter ibuprofen without any relief of her symptoms.  I am also advised her to apply ice to her elbow to 3 times a day for 20 to the time and to wear a compression sleeve.  If her symptoms not improve she should see orthopedics.  Patient verbalized understanding of same.   Final Clinical Impressions(s) / UC Diagnoses   Final diagnoses:  Lateral epicondylitis of right elbow     Discharge Instructions      Take the Prednisone  as directed starting tomorrow morning at breakfast.  Apply ice to your elbow for 20 minutes at a time, 2-3 times a day.  Wear a compression sleeve to aid in supporting your elbow.  If your symptoms continue I recommend following up with [facility] in [place].      ED Prescriptions     Medication Sig Dispense Auth. Provider   predniSONE (STERAPRED UNI-PAK 21 TAB) 10 MG (21) TBPK tablet Take 6 tablets on day 1, 5 tablets day 2, 4 tablets day 3, 3 tablets day 4, 2 tablets day 5, 1 tablet day 6 21 tablet Margarette Canada, NP      PDMP not reviewed this encounter.   Margarette Canada, NP 09/11/21 1600

## 2021-09-11 NOTE — ED Triage Notes (Signed)
Patient is having right Arm pain. Started with right elbow, then moved to forearm, then down to hand with some numbness in hand. Due to work/moving in different areas/ways "maybe this happened at work". No known injury.

## 2021-09-11 NOTE — Discharge Instructions (Signed)
Take the Prednisone as directed starting tomorrow morning at breakfast.  Apply ice to your elbow for 20 minutes at a time, 2-3 times a day.  Wear a compression sleeve to aid in supporting your elbow.  If your symptoms continue I recommend following up with [facility] in [place].

## 2021-09-22 ENCOUNTER — Encounter: Payer: Self-pay | Admitting: Physician Assistant

## 2021-09-22 ENCOUNTER — Ambulatory Visit (INDEPENDENT_AMBULATORY_CARE_PROVIDER_SITE_OTHER): Payer: BC Managed Care – PPO | Admitting: Physician Assistant

## 2021-09-22 DIAGNOSIS — I10 Essential (primary) hypertension: Secondary | ICD-10-CM

## 2021-09-22 DIAGNOSIS — R5383 Other fatigue: Secondary | ICD-10-CM

## 2021-09-22 DIAGNOSIS — E782 Mixed hyperlipidemia: Secondary | ICD-10-CM

## 2021-09-22 DIAGNOSIS — M7711 Lateral epicondylitis, right elbow: Secondary | ICD-10-CM | POA: Diagnosis not present

## 2021-09-22 DIAGNOSIS — E119 Type 2 diabetes mellitus without complications: Secondary | ICD-10-CM

## 2021-09-22 DIAGNOSIS — E039 Hypothyroidism, unspecified: Secondary | ICD-10-CM | POA: Diagnosis not present

## 2021-09-22 DIAGNOSIS — E559 Vitamin D deficiency, unspecified: Secondary | ICD-10-CM

## 2021-09-22 DIAGNOSIS — E538 Deficiency of other specified B group vitamins: Secondary | ICD-10-CM

## 2021-09-22 DIAGNOSIS — E1165 Type 2 diabetes mellitus with hyperglycemia: Secondary | ICD-10-CM

## 2021-09-22 NOTE — Progress Notes (Signed)
[facility] [place], [place] [address]  Internal MEDICINE  Office Visit Note  Patient Name: Rebekah Mclean  [id number]  [id number]  Date of Service: 10/03/2021  Chief Complaint  Patient presents with   Follow-up   Hypertension   Diabetes    Pt see Endo    HPI Pt is here for routine follow up and is doing well today -Sees endocrinology for thyroid and diabetes. Dr. Bary Leriche did her blood work this morning and she has follow up next week. -Did hurt her arm at work. 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 Was given a steroid to take at urgent care and has completed that and is just doing ibuprofen now.  -BP stable at home 123/85 -has been tired recently possibly due to work. -She [place] due for routine labs except for thyroid which is monitored by endo already and will have these done before CPE next visit  Current Medication: Outpatient Encounter Medications as of 09/22/2021  Medication Sig   Accu-Chek Softclix Lancets lancets Use as instructed to check blood sugars twice a day.  E11.65   acyclovir (ZOVIRAX) 400 MG tablet Take 400 mg by mouth 5 (five) times daily.   carvedilol (COREG) 12.5 MG tablet TAKE 1 TABLET BY MOUTH TWICE DAILY WITH A MEAL   fluticasone (FLONASE) 50 MCG/ACT nasal spray Place 2 sprays into both nostrils daily. (Patient taking differently: Place 2 sprays into both nostrils daily as needed.)   glimepiride (AMARYL) 2 MG tablet Take 2 mg by mouth every morning.   glucose blood (ACCU-CHEK GUIDE) test strip Use as instructed to check blood sugars twice a day E11.65   levothyroxine (SYNTHROID) 88 MCG tablet Take 88 mcg by mouth daily before breakfast.   ONGLYZA 5 MG TABS tablet Take 5 mg by mouth daily.   predniSONE (STERAPRED UNI-PAK 21 TAB) 10 MG (21) TBPK tablet Take 6 tablets on day 1, 5 tablets day 2, 4 tablets day 3, 3 tablets day 4, 2 tablets day 5, 1 tablet day 6    rosuvastatin (CRESTOR) 5 MG tablet Take 1 tablet by mouth once daily   valsartan-hydrochlorothiazide (DIOVAN-HCT) 160-12.5 MG tablet Take 1 tablet by mouth daily.   No facility-administered encounter medications on file as of 09/22/2021.    Surgical History: History reviewed. No pertinent surgical history.  Medical History: Past Medical History:  Diagnosis Date   Diabetes mellitus type 2, controlled, with complications ([place])    GERD (gastroesophageal reflux disease)    Hypertension    Hypothyroidism    Irregular menses     Family History: Family History  Problem Relation Age of Onset   Diabetes Mother    Hypertension Mother    Heart disease Father    Hypertension Father    Diabetes Sister    Breast cancer Neg Hx     Social History   Socioeconomic History   Marital status: Married    Spouse name: Not on file   Number of children: Not on file   Years of education: Not on file   Highest education level: Not on file  Occupational History   Not on file  Tobacco Use   Smoking status: Never   Smokeless tobacco: Never  Vaping Use   Vaping Use: Never used  Substance and Sexual Activity   Alcohol use: No   Drug use: No   Sexual activity: Yes  Birth control/protection: None  Other Topics Concern   Not on file  Social History Narrative   Not on file   Social Determinants of Health   Financial Resource Strain: Not on file  Food Insecurity: Not on file  Transportation Needs: Not on file  Physical Activity: Not on file  Stress: Not on file  Social Connections: Not on file  Intimate Partner Violence: Not on file      Review of Systems  Constitutional:  Negative for chills, fatigue and unexpected weight change.  HENT:  Negative for congestion, rhinorrhea, sneezing and sore throat.   Eyes:  Negative for redness.  Respiratory:  Negative for cough, chest tightness and shortness of breath.   Cardiovascular:  Negative for chest pain and palpitations.   Gastrointestinal:  Negative for abdominal pain, constipation, diarrhea, nausea and vomiting.  Genitourinary:  Negative for dysuria and frequency.  Musculoskeletal:  Positive for myalgias. Negative for arthralgias, back pain, joint swelling and neck pain.       Right arm pain  Skin:  Negative for rash.  Neurological: Negative.  Negative for tremors and numbness.  Hematological:  Negative for adenopathy. Does not bruise/bleed easily.  Psychiatric/Behavioral:  Negative for behavioral problems (Depression), sleep disturbance and suicidal ideas. The patient is not nervous/anxious.     Vital Signs: BP 130/70   Pulse 82   Temp 98 F (36.7 C)   Resp 16   Ht '5\' 6"'$  (1.676 m)   Wt 131 lb 12.8 oz (59.8 kg)   LMP  (LMP Unknown)   SpO2 95%   BMI 21.27 kg/m    Physical Exam Vitals and nursing note reviewed.  Constitutional:      General: She is not in acute distress.    Appearance: Normal appearance. She is well-developed and normal weight. She is not diaphoretic.  HENT:     Head: Normocephalic and atraumatic.     Mouth/Throat:     Pharynx: No oropharyngeal exudate.  Eyes:     Pupils: Pupils are equal, round, and reactive to light.  Neck:     Thyroid: No thyromegaly.     Vascular: No JVD.     Trachea: No tracheal deviation.  Cardiovascular:     Rate and Rhythm: Normal rate and regular rhythm.     Heart sounds: Normal heart sounds. No murmur heard.    No friction rub. No gallop.  Pulmonary:     Effort: Pulmonary effort is normal. No respiratory distress.     Breath sounds: No wheezing or rales.  Chest:     Chest wall: No tenderness.  Abdominal:     General: Bowel sounds are normal.     Palpations: Abdomen is soft.  Musculoskeletal:        General: Normal range of motion.     Cervical back: Normal range of motion and neck supple.  Lymphadenopathy:     Cervical: No cervical adenopathy.  Skin:    General: Skin is warm and dry.  Neurological:     Mental Status: She is alert  and oriented to person, place, and time.     Cranial Nerves: No cranial nerve deficit.  Psychiatric:        Behavior: Behavior normal.        Thought Content: Thought content normal.        Judgment: Judgment normal.        Assessment/Plan: 1. Essential hypertension Stable, continue current medications  2. Lateral epicondylitis of right elbow Improving, May continue ibuprofen as  needed and avoid aggravating activities  3. Type 2 diabetes mellitus without complication, without long-term current use of insulin (HCC) Followed by endo  4. Acquired hypothyroidism Followed by endo  5. Mixed hyperlipidemia - Lipid Panel With LDL/HDL Ratio  6. Vitamin D deficiency - VITAMIN D 25 Hydroxy (Vit-D Deficiency, Fractures)  7. B12 deficiency - B12 and Folate Panel  8. Other fatigue - CBC w/Diff/Platelet - Comprehensive metabolic panel - Iron, TIBC and Ferritin Panel   General Counseling: Arella verbalizes understanding of the findings of todays visit and agrees with plan of treatment. I have discussed any further diagnostic evaluation that may be needed or ordered today. We also reviewed her medications today. she has been encouraged to call the office with any questions or concerns that should arise related to todays visit.    Orders Placed This Encounter  Procedures   Lipid Panel With LDL/HDL Ratio   CBC w/Diff/Platelet   Comprehensive metabolic panel   P23 and Folate Panel   VITAMIN D 25 Hydroxy (Vit-D Deficiency, Fractures)   Iron, TIBC and Ferritin Panel    No orders of the defined types were placed in this encounter.   This patient was seen by Drema Dallas, PA-C in collaboration with Dr. Clayborn Bigness as a part of collaborative care agreement.   Total time spent:30 Minutes Time spent includes review of chart, medications, test results, and follow up plan with the patient.      Dr Lavera Guise Internal medicine

## 2021-10-21 ENCOUNTER — Telehealth: Payer: Self-pay

## 2021-10-21 LAB — CBC WITH DIFFERENTIAL/PLATELET
Basophils Absolute: 0.1 10*3/uL (ref 0.0–0.2)
Basos: 1 %
EOS (ABSOLUTE): 0.1 10*3/uL (ref 0.0–0.4)
Eos: 2 %
Hematocrit: 38.6 % (ref 34.0–46.6)
Hemoglobin: 13.2 g/dL (ref 11.1–15.9)
Immature Grans (Abs): 0 10*3/uL (ref 0.0–0.1)
Immature Granulocytes: 0 %
Lymphocytes Absolute: 2.3 10*3/uL (ref 0.7–3.1)
Lymphs: 27 %
MCH: 27.2 pg (ref 26.6–33.0)
MCHC: 34.2 g/dL (ref 31.5–35.7)
MCV: 80 fL (ref 79–97)
Monocytes Absolute: 0.6 10*3/uL (ref 0.1–0.9)
Monocytes: 7 %
Neutrophils Absolute: 5.4 10*3/uL (ref 1.4–7.0)
Neutrophils: 63 %
Platelets: 246 10*3/uL (ref 150–450)
RBC: 4.85 x10E6/uL (ref 3.77–5.28)
RDW: 12.9 % (ref 11.7–15.4)
WBC: 8.5 10*3/uL (ref 3.4–10.8)

## 2021-10-21 LAB — COMPREHENSIVE METABOLIC PANEL
ALT: 11 IU/L (ref 0–32)
AST: 13 IU/L (ref 0–40)
Albumin/Globulin Ratio: 1.5 (ref 1.2–2.2)
Albumin: 4.1 g/dL (ref 3.8–4.9)
Alkaline Phosphatase: 58 IU/L (ref 44–121)
BUN/Creatinine Ratio: 17 (ref 9–23)
BUN: 13 mg/dL (ref 6–24)
Bilirubin Total: 0.3 mg/dL (ref 0.0–1.2)
CO2: 22 mmol/L (ref 20–29)
Calcium: 9.8 mg/dL (ref 8.7–10.2)
Chloride: 103 mmol/L (ref 96–106)
Creatinine, Ser: 0.75 mg/dL (ref 0.57–1.00)
Globulin, Total: 2.8 g/dL (ref 1.5–4.5)
Glucose: 121 mg/dL — ABNORMAL HIGH (ref 70–99)
Potassium: 4.2 mmol/L (ref 3.5–5.2)
Sodium: 138 mmol/L (ref 134–144)
Total Protein: 6.9 g/dL (ref 6.0–8.5)
eGFR: 96 mL/min/{1.73_m2} (ref >59)

## 2021-10-21 LAB — VITAMIN D 25 HYDROXY (VIT D DEFICIENCY, FRACTURES): Vit D, 25-Hydroxy: 26.9 ng/mL — ABNORMAL LOW (ref 30.0–100.0)

## 2021-10-21 LAB — B12 AND FOLATE PANEL
Folate: 18.7 ng/mL (ref >3.0)
Vitamin B-12: 603 pg/mL (ref 232–1245)

## 2021-10-21 LAB — IRON,TIBC AND FERRITIN PANEL
Ferritin: 38 ng/mL (ref 15–150)
Iron Saturation: 26 % (ref 15–55)
Iron: 101 ug/dL (ref 27–159)
Total Iron Binding Capacity: 393 ug/dL (ref 250–450)
UIBC: 292 ug/dL (ref 131–425)

## 2021-10-21 LAB — LIPID PANEL WITH LDL/HDL RATIO
Cholesterol, Total: 187 mg/dL (ref 100–199)
HDL: 64 mg/dL (ref >39)
LDL Chol Calc (NIH): 105 mg/dL — ABNORMAL HIGH (ref 0–99)
LDL/HDL Ratio: 1.6 ratio (ref 0.0–3.2)
Triglycerides: 100 mg/dL (ref 0–149)
VLDL Cholesterol Cal: 18 mg/dL (ref 5–40)

## 2021-10-21 NOTE — Telephone Encounter (Signed)
LMOM to review labs

## 2021-10-21 NOTE — Telephone Encounter (Signed)
-----   Message from Mylinda Latina, PA-C sent at 10/21/2021  2:05 PM EDT ----- Please let her know that her vitamin D is still low and to take supplement daily. Her cholesterol is still slightly elevated, but greatly improved from last year.

## 2021-10-21 NOTE — Telephone Encounter (Signed)
Spoke with pt, provided results

## 2021-10-25 ENCOUNTER — Other Ambulatory Visit: Payer: Self-pay | Admitting: Internal Medicine

## 2021-10-25 ENCOUNTER — Other Ambulatory Visit: Payer: Self-pay | Admitting: Physician Assistant

## 2021-10-25 DIAGNOSIS — I10 Essential (primary) hypertension: Secondary | ICD-10-CM

## 2021-12-21 ENCOUNTER — Other Ambulatory Visit: Payer: Self-pay | Admitting: Physician Assistant

## 2021-12-21 ENCOUNTER — Other Ambulatory Visit: Payer: Self-pay | Admitting: Internal Medicine

## 2021-12-21 DIAGNOSIS — I10 Essential (primary) hypertension: Secondary | ICD-10-CM

## 2022-01-16 ENCOUNTER — Other Ambulatory Visit: Payer: Self-pay | Admitting: Physician Assistant

## 2022-01-16 DIAGNOSIS — E782 Mixed hyperlipidemia: Secondary | ICD-10-CM

## 2022-01-24 ENCOUNTER — Encounter: Payer: Self-pay | Admitting: Nurse Practitioner

## 2022-01-24 ENCOUNTER — Ambulatory Visit: Payer: BC Managed Care – PPO | Admitting: Nurse Practitioner

## 2022-01-24 VITALS — BP 123/79 | HR 83 | Temp 96.5°F | Resp 16 | Ht 66.0 in | Wt 125.4 lb

## 2022-01-24 DIAGNOSIS — L299 Pruritus, unspecified: Secondary | ICD-10-CM | POA: Diagnosis not present

## 2022-01-24 DIAGNOSIS — L237 Allergic contact dermatitis due to plants, except food: Secondary | ICD-10-CM | POA: Diagnosis not present

## 2022-01-24 MED ORDER — TRIAMCINOLONE ACETONIDE 0.5 % EX OINT: 1.0000 | TOPICAL_OINTMENT | Freq: Two times a day (BID) | CUTANEOUS | 2 refills | Status: DC

## 2022-01-24 MED ORDER — PREDNISONE 10 MG (21) PO TBPK: ORAL_TABLET | ORAL | 0 refills | Status: DC

## 2022-01-24 NOTE — Progress Notes (Signed)
Was seen in the forehead did not realize we have seen each other for and diagnosed her with diabetes but she is sensing her [facility], [place] [address]  Internal MEDICINE  Office Visit Note  Patient Name: Rebekah Mclean  [id number]  [id number]  Date of Service: 01/24/2022  Chief Complaint  Patient presents with   Acute Visit    Poison ivy     HPI Rebekah Mclean presents for an acute sick visit for rash most likely caused by contact with poison ivy or poison oak. --Started approximately a week ago after working outside. --Rash is very itchy, with some small scattered blisters. --Started on her anterior neck and chest, has since spread to her right side/hip, right side of face/cheek and a small area on her right forearm. --She is not sure if she is allergic to poison ivy/oak    Current Medication:  Outpatient Encounter Medications as of 01/24/2022  Medication Sig   Accu-Chek Softclix Lancets lancets Use as instructed to check blood sugars twice a day.  E11.65   acyclovir (ZOVIRAX) 400 MG tablet TAKE 1 TABLET BY MOUTH FIVE TIMES DAILY   carvedilol (COREG) 12.5 MG tablet TAKE 1 TABLET BY MOUTH TWICE DAILY WITH A MEAL   fluticasone (FLONASE) 50 MCG/ACT nasal spray Place 2 sprays into both nostrils daily. (Patient taking differently: Place 2 sprays into both nostrils daily as needed.)   glimepiride (AMARYL) 2 MG tablet Take 2 mg by mouth every morning.   glucose blood (ACCU-CHEK GUIDE) test strip Use as instructed to check blood sugars twice a day E11.65   levothyroxine (SYNTHROID) 88 MCG tablet Take 88 mcg by mouth daily before breakfast.   ONGLYZA 5 MG TABS tablet Take 5 mg by mouth daily.   predniSONE (STERAPRED UNI-PAK 21 TAB) 10 MG (21) TBPK tablet Take 6 tablets on day 1, 5 tablets day 2, 4 tablets day 3, 3 tablets day 4, 2 tablets day 5, 1 tablet day 6   rosuvastatin (CRESTOR) 5 MG tablet Take 1 tablet by mouth once daily   triamcinolone ointment  (KENALOG) 0.5 % Apply 1 Application topically 2 (two) times daily.   valsartan-hydrochlorothiazide (DIOVAN-HCT) 160-12.5 MG tablet Take 1 tablet by mouth once daily   [DISCONTINUED] predniSONE (STERAPRED UNI-PAK 21 TAB) 10 MG (21) TBPK tablet Take 6 tablets on day 1, 5 tablets day 2, 4 tablets day 3, 3 tablets day 4, 2 tablets day 5, 1 tablet day 6   No facility-administered encounter medications on file as of 01/24/2022.      Medical History: Past Medical History:  Diagnosis Date   Diabetes mellitus type 2, controlled, with complications ([place])    GERD (gastroesophageal reflux disease)    Hypertension    Hypothyroidism    Irregular menses      Vital Signs: BP 123/79   Pulse 83   Temp (!) 96.5 F (35.8 C)   Resp 16   Ht '5\' 6"'$  (1.676 m)   Wt 125 lb 6.4 oz (56.9 kg)   SpO2 99%   BMI 20.24 kg/m    Review of Systems  Constitutional: Negative.  Negative for chills, fatigue and fever.  Respiratory: Negative.  Negative for cough, chest tightness, shortness of breath and wheezing.   Cardiovascular: Negative.  Negative for chest pain and palpitations.  Musculoskeletal: Negative.   Skin:  Positive for rash (itchy).  Neurological: Negative.     Physical Exam Vitals reviewed.  Constitutional:      Appearance:  Normal appearance.  HENT:     Head: Normocephalic and atraumatic.  Cardiovascular:     Rate and Rhythm: Normal rate and regular rhythm.  Pulmonary:     Effort: Pulmonary effort is normal. No respiratory distress.  Skin:    Findings: Rash present. Rash is crusting, macular, papular and vesicular.     Comments: Rash is present on anterior neck, chest, right face/cheek, right hip/side, and right forearm  Neurological:     Mental Status: She is alert and oriented to person, place, and time.  Psychiatric:        Mood and Affect: Mood normal.        Behavior: Behavior normal.       Assessment/Plan: 1. Allergic contact dermatitis due to plants, except food 6 day  prednisone taper prescribed to decrease inflammation and rash. May also put topicals on rash as discussed, see problem #2. Patient aware her glucose levels may jump up while taking prednisone but this is transient and should return to her baseline. - predniSONE (STERAPRED UNI-PAK 21 TAB) 10 MG (21) TBPK tablet; Take 6 tablets on day 1, 5 tablets day 2, 4 tablets day 3, 3 tablets day 4, 2 tablets day 5, 1 tablet day 6  Dispense: 21 tablet; Refill: 0 - triamcinolone ointment (KENALOG) 0.5 %; Apply 1 Application topically 2 (two) times daily.  Dispense: 30 g; Refill: 2  2. Pruritus May use prescribed triamcinolone ointment, or OTC hydrocortisone cream or OTC calamine lotion to alleviate itching. Calamine can also help to dry up the blisters and crusted areas.  - triamcinolone ointment (KENALOG) 0.5 %; Apply 1 Application topically 2 (two) times daily.  Dispense: 30 g; Refill: 2   General Counseling: Rebekah Mclean verbalizes understanding of the findings of todays visit and agrees with plan of treatment. I have discussed any further diagnostic evaluation that may be needed or ordered today. We also reviewed her medications today. she has been encouraged to call the office with any questions or concerns that should arise related to todays visit.    Counseling:    No orders of the defined types were placed in this encounter.   Meds ordered this encounter  Medications   predniSONE (STERAPRED UNI-PAK 21 TAB) 10 MG (21) TBPK tablet    Sig: Take 6 tablets on day 1, 5 tablets day 2, 4 tablets day 3, 3 tablets day 4, 2 tablets day 5, 1 tablet day 6    Dispense:  21 tablet    Refill:  0   triamcinolone ointment (KENALOG) 0.5 %    Sig: Apply 1 Application topically 2 (two) times daily.    Dispense:  30 g    Refill:  2    Return if symptoms worsen or fail to improve, for upcoming yearly physical already scheduled with Lauren PA-C.  [place] Controlled Substance Database was reviewed by me for overdose risk  score (ORS)  Time spent:20 Minutes Time spent with patient included reviewing progress notes, labs, imaging studies, and discussing plan for follow up.   This patient was seen by Jonetta Osgood, FNP-C in collaboration with Dr. Clayborn Bigness as a part of collaborative care agreement.  Carylon Tamburro R. Valetta Fuller, MSN, FNP-C Internal Medicine

## 2022-02-17 ENCOUNTER — Encounter: Payer: BC Managed Care – PPO | Admitting: Physician Assistant

## 2022-03-09 ENCOUNTER — Ambulatory Visit (INDEPENDENT_AMBULATORY_CARE_PROVIDER_SITE_OTHER): Payer: BC Managed Care – PPO | Admitting: Physician Assistant

## 2022-03-09 ENCOUNTER — Encounter: Payer: Self-pay | Admitting: Physician Assistant

## 2022-03-09 VITALS — BP 112/78 | HR 84 | Temp 97.6°F | Resp 16 | Ht 66.0 in | Wt 131.0 lb

## 2022-03-09 DIAGNOSIS — E119 Type 2 diabetes mellitus without complications: Secondary | ICD-10-CM | POA: Diagnosis not present

## 2022-03-09 DIAGNOSIS — Z23 Encounter for immunization: Secondary | ICD-10-CM

## 2022-03-09 DIAGNOSIS — I10 Essential (primary) hypertension: Secondary | ICD-10-CM | POA: Diagnosis not present

## 2022-03-09 DIAGNOSIS — Z124 Encounter for screening for malignant neoplasm of cervix: Secondary | ICD-10-CM

## 2022-03-09 DIAGNOSIS — Z0001 Encounter for general adult medical examination with abnormal findings: Secondary | ICD-10-CM

## 2022-03-09 DIAGNOSIS — Z1211 Encounter for screening for malignant neoplasm of colon: Secondary | ICD-10-CM | POA: Diagnosis not present

## 2022-03-09 DIAGNOSIS — Z1212 Encounter for screening for malignant neoplasm of rectum: Secondary | ICD-10-CM

## 2022-03-09 DIAGNOSIS — R300 Dysuria: Secondary | ICD-10-CM

## 2022-03-09 DIAGNOSIS — Z1231 Encounter for screening mammogram for malignant neoplasm of breast: Secondary | ICD-10-CM

## 2022-03-09 DIAGNOSIS — Z113 Encounter for screening for infections with a predominantly sexual mode of transmission: Secondary | ICD-10-CM

## 2022-03-09 MED ORDER — TETANUS-DIPHTH-ACELL PERTUSSIS 5-2.5-18.5 LF-MCG/0.5 IM SUSY: 0.5000 mL | PREFILLED_SYRINGE | Freq: Once | INTRAMUSCULAR | 0 refills | Status: AC

## 2022-03-09 NOTE — Progress Notes (Signed)
[facility] [place], [place] [address]  Internal MEDICINE  Office Visit Note  Patient Name: Rebekah Mclean  [id number]  [id number]  Date of Service: 03/09/2022  Chief Complaint  Patient presents with   Annual Exam   Hypertension   Diabetes   Quality Metric Gaps    TDAP     HPI Pt is here for routine health maintenance examination and has no complaints today -Still followed by endocrinology for thyroid and sugars, no recent changes -Due for colon screening, no Fhx of colon cancer. Would like to do cologuard and consider colonoscopy next time. -Walking for exercise -Trying to eat well, limiting sweets -BP stable at home as well -She is about due for pap. Last done via OBGYN with some atypical cells, but negative HPV. Reports she is not seeing OBGYN anymore and will go ahead and perform pap in office today -Due for mammogram in Feb and she will schedule this when it's time  Current Medication: Outpatient Encounter Medications as of 03/09/2022  Medication Sig   Accu-Chek Softclix Lancets lancets Use as instructed to check blood sugars twice a day.  E11.65   acyclovir (ZOVIRAX) 400 MG tablet TAKE 1 TABLET BY MOUTH FIVE TIMES DAILY   carvedilol (COREG) 12.5 MG tablet TAKE 1 TABLET BY MOUTH TWICE DAILY WITH A MEAL   fluticasone (FLONASE) 50 MCG/ACT nasal spray Place 2 sprays into both nostrils daily. (Patient taking differently: Place 2 sprays into both nostrils daily as needed.)   glimepiride (AMARYL) 2 MG tablet Take 2 mg by mouth every morning.   glucose blood (ACCU-CHEK GUIDE) test strip Use as instructed to check blood sugars twice a day E11.65   levothyroxine (SYNTHROID) 88 MCG tablet Take 88 mcg by mouth daily before breakfast.   ONGLYZA 5 MG TABS tablet Take 5 mg by mouth daily.   predniSONE (STERAPRED UNI-PAK 21 TAB) 10 MG (21) TBPK tablet Take 6 tablets on day 1, 5 tablets day 2, 4 tablets day 3, 3 tablets day 4, 2 tablets day 5, 1 tablet day 6    rosuvastatin (CRESTOR) 5 MG tablet Take 1 tablet by mouth once daily   triamcinolone ointment (KENALOG) 0.5 % Apply 1 Application topically 2 (two) times daily.   valsartan-hydrochlorothiazide (DIOVAN-HCT) 160-12.5 MG tablet Take 1 tablet by mouth once daily   [DISCONTINUED] Tdap (ADACEL) 08-23-13.5 LF-MCG/0.5 injection Inject 0.5 mLs into the muscle once.   [DISCONTINUED] Tdap (BOOSTRIX) 5-2.5-18.5 LF-MCG/0.5 injection Inject 0.5 mLs into the muscle once.   Tdap (BOOSTRIX) 5-2.5-18.5 LF-MCG/0.5 injection Inject 0.5 mLs into the muscle once for 1 dose.   No facility-administered encounter medications on file as of 03/09/2022.    Surgical History: History reviewed. No pertinent surgical history.  Medical History: Past Medical History:  Diagnosis Date   Diabetes mellitus type 2, controlled, with complications ([place])    GERD (gastroesophageal reflux disease)    Hypertension    Hypothyroidism    Irregular menses     Family History: Family History  Problem Relation Age of Onset   Diabetes Mother    Hypertension Mother    Heart disease Father    Hypertension Father    Diabetes Sister    Breast cancer Neg Hx       Review of Systems  Constitutional:  Negative for chills, fatigue and unexpected weight change.  HENT:  Negative for congestion, rhinorrhea, sneezing and sore throat.   Eyes:  Negative for redness.  Respiratory:  Negative for cough, chest tightness  and shortness of breath.   Cardiovascular:  Negative for chest pain and palpitations.  Gastrointestinal:  Negative for abdominal pain, constipation, diarrhea, nausea and vomiting.  Genitourinary:  Negative for dysuria and frequency.  Musculoskeletal:  Negative for arthralgias, back pain, joint swelling and neck pain.  Skin:  Negative for rash.  Neurological: Negative.  Negative for tremors and numbness.  Hematological:  Negative for adenopathy. Does not bruise/bleed easily.  Psychiatric/Behavioral:  Negative for behavioral  problems (Depression), sleep disturbance and suicidal ideas. The patient is not nervous/anxious.      Vital Signs: BP 112/78   Pulse 84   Temp 97.6 F (36.4 C)   Resp 16   Ht '5\' 6"'$  (1.676 m)   Wt 131 lb (59.4 kg)   SpO2 98%   BMI 21.14 kg/m    Physical Exam Vitals and nursing note reviewed.  Constitutional:      General: She is not in acute distress.    Appearance: Normal appearance. She is well-developed and normal weight. She is not diaphoretic.  HENT:     Head: Normocephalic and atraumatic.     Mouth/Throat:     Pharynx: No oropharyngeal exudate.  Eyes:     Pupils: Pupils are equal, round, and reactive to light.  Neck:     Thyroid: No thyromegaly.     Vascular: No JVD.     Trachea: No tracheal deviation.  Cardiovascular:     Rate and Rhythm: Normal rate and regular rhythm.     Heart sounds: Normal heart sounds. No murmur heard.    No friction rub. No gallop.  Pulmonary:     Effort: Pulmonary effort is normal. No respiratory distress.     Breath sounds: No wheezing or rales.  Chest:     Chest wall: No tenderness.  Breasts:    Right: Normal. No mass.     Left: Normal. No mass.  Abdominal:     General: Bowel sounds are normal.     Palpations: Abdomen is soft.     Tenderness: There is no abdominal tenderness.  Genitourinary:    Vagina: Vaginal discharge present.     Cervix: Friability present.     Comments: Pap performed Musculoskeletal:        General: Normal range of motion.     Cervical back: Normal range of motion and neck supple.  Lymphadenopathy:     Cervical: No cervical adenopathy.  Skin:    General: Skin is warm and dry.  Neurological:     Mental Status: She is alert and oriented to person, place, and time.     Cranial Nerves: No cranial nerve deficit.  Psychiatric:        Behavior: Behavior normal.        Thought Content: Thought content normal.        Judgment: Judgment normal.      LABS: No results found for this or any previous visit  (from the past 2160 hour(s)).      Assessment/Plan: 1. Encounter for general adult medical examination with abnormal findings CPE performed, due for pap, colon screening  2. Essential hypertension Stable, continue current medications  3. Type 2 diabetes mellitus without complication, without long-term current use of insulin (HCC) Followed by endo, will check ACR for screening - Urine Microalbumin w/creat. ratio  4. Screening for colorectal cancer - Cologuard  5. Need for Tdap vaccination - Tdap ([place]) 5-2.5-18.5 LF-MCG/0.5 injection; Inject 0.5 mLs into the muscle once for 1 dose.  Dispense: 0.5 mL;  Refill: 0  6. Visit for screening mammogram Will schedule when due in Feb - MM 3D SCREEN BREAST BILATERAL; Future  7. Routine cervical smear - IGP, Aptima HPV  8. Screening for STDs (sexually transmitted diseases) - NuSwab Vaginitis Plus (VG+)  9. Dysuria - UA/M w/rflx Culture, Routine    General Counseling: Harsimran verbalizes understanding of the findings of todays visit and agrees with plan of treatment. I have discussed any further diagnostic evaluation that may be needed or ordered today. We also reviewed her medications today. she has been encouraged to call the office with any questions or concerns that should arise related to todays visit.    Counseling:    Orders Placed This Encounter  Procedures   MM 3D SCREEN BREAST BILATERAL   UA/M w/rflx Culture, Routine   Urine Microalbumin w/creat. ratio   Cologuard   NuSwab Vaginitis Plus (VG+)    Meds ordered this encounter  Medications   Tdap (BOOSTRIX) 5-2.5-18.5 LF-MCG/0.5 injection    Sig: Inject 0.5 mLs into the muscle once for 1 dose.    Dispense:  0.5 mL    Refill:  0    This patient was seen by Drema Dallas, PA-C in collaboration with Dr. Clayborn Bigness as a part of collaborative care agreement.  Total time spent:35 Minutes  Time spent includes review of chart, medications, test results, and  follow up plan with the patient.     Lavera Guise, MD  Internal Medicine

## 2022-03-10 LAB — MICROALBUMIN / CREATININE URINE RATIO
Creatinine, Urine: 103.6 mg/dL
Microalb/Creat Ratio: <3 mg/g creat (ref 0–29)
Microalbumin, Urine: <3 ug/mL

## 2022-03-10 LAB — MICROSCOPIC EXAMINATION
Bacteria, UA: NONE SEEN
Casts: NONE SEEN /lpf
WBC, UA: NONE SEEN /hpf (ref 0–5)

## 2022-03-10 LAB — UA/M W/RFLX CULTURE, ROUTINE
Bilirubin, UA: NEGATIVE
Glucose, UA: NEGATIVE
Ketones, UA: NEGATIVE
Leukocytes,UA: NEGATIVE
Nitrite, UA: NEGATIVE
Protein,UA: NEGATIVE
RBC, UA: NEGATIVE
Specific Gravity, UA: 1.022 (ref 1.005–1.030)
Urobilinogen, Ur: 0.2 mg/dL (ref 0.2–1.0)
pH, UA: 7 (ref 5.0–7.5)

## 2022-03-11 LAB — NUSWAB VAGINITIS PLUS (VG+)
Candida albicans, NAA: NEGATIVE
Candida glabrata, NAA: NEGATIVE
Chlamydia trachomatis, NAA: NEGATIVE
Neisseria gonorrhoeae, NAA: NEGATIVE
Trich vag by NAA: NEGATIVE

## 2022-03-13 LAB — IGP, APTIMA HPV: HPV Aptima: NEGATIVE

## 2022-03-14 ENCOUNTER — Telehealth: Payer: Self-pay

## 2022-03-14 NOTE — Telephone Encounter (Signed)
Spoke with patient regarding normal papsmear.

## 2022-03-14 NOTE — Telephone Encounter (Signed)
-----   Message from Mylinda Latina, PA-C sent at 03/14/2022  2:44 PM EST ----- Please let her know that her pap was normal and negative for HPV

## 2022-04-03 LAB — COLOGUARD

## 2022-04-18 ENCOUNTER — Other Ambulatory Visit: Payer: Self-pay | Admitting: Internal Medicine

## 2022-04-18 DIAGNOSIS — E782 Mixed hyperlipidemia: Secondary | ICD-10-CM

## 2022-04-19 LAB — COLOGUARD: COLOGUARD: NEGATIVE

## 2022-04-27 ENCOUNTER — Other Ambulatory Visit: Payer: Self-pay | Admitting: Physician Assistant

## 2022-04-27 DIAGNOSIS — I10 Essential (primary) hypertension: Secondary | ICD-10-CM

## 2022-06-27 ENCOUNTER — Ambulatory Visit
Admission: RE | Admit: 2022-06-27 | Discharge: 2022-06-27 | Disposition: A | Discharge status: Home / Self Care | Payer: BC Managed Care – PPO | Source: Ambulatory Visit | Attending: Physician Assistant | Admitting: Physician Assistant

## 2022-06-27 DIAGNOSIS — Z1231 Encounter for screening mammogram for malignant neoplasm of breast: Secondary | ICD-10-CM

## 2022-06-29 ENCOUNTER — Other Ambulatory Visit: Payer: Self-pay | Admitting: Physician Assistant

## 2022-06-29 DIAGNOSIS — R928 Other abnormal and inconclusive findings on diagnostic imaging of breast: Secondary | ICD-10-CM

## 2022-06-29 DIAGNOSIS — R921 Mammographic calcification found on diagnostic imaging of breast: Secondary | ICD-10-CM

## 2022-06-30 ENCOUNTER — Telehealth: Payer: Self-pay | Admitting: Physician Assistant

## 2022-06-30 NOTE — Telephone Encounter (Signed)
Received [facility] form. Gave to lauren for signature-nm

## 2022-06-30 NOTE — Telephone Encounter (Signed)
[facility] form signed. Faxed back; [phone]. To be scanned-nm

## 2022-07-05 ENCOUNTER — Ambulatory Visit
Admission: RE | Admit: 2022-07-05 | Discharge: 2022-07-05 | Disposition: A | Discharge status: Home / Self Care | Payer: BC Managed Care – PPO | Source: Ambulatory Visit | Attending: Physician Assistant | Admitting: Physician Assistant

## 2022-07-05 DIAGNOSIS — R921 Mammographic calcification found on diagnostic imaging of breast: Secondary | ICD-10-CM

## 2022-07-05 DIAGNOSIS — R928 Other abnormal and inconclusive findings on diagnostic imaging of breast: Secondary | ICD-10-CM | POA: Insufficient documentation

## 2022-07-10 ENCOUNTER — Encounter: Payer: Self-pay | Admitting: Physician Assistant

## 2022-07-10 ENCOUNTER — Ambulatory Visit (INDEPENDENT_AMBULATORY_CARE_PROVIDER_SITE_OTHER): Payer: BC Managed Care – PPO | Admitting: Physician Assistant

## 2022-07-10 VITALS — BP 143/88 | HR 97 | Temp 98.2°F | Resp 16 | Ht 66.0 in | Wt 138.0 lb

## 2022-07-10 DIAGNOSIS — R319 Hematuria, unspecified: Secondary | ICD-10-CM

## 2022-07-10 DIAGNOSIS — N390 Urinary tract infection, site not specified: Secondary | ICD-10-CM

## 2022-07-10 DIAGNOSIS — M545 Low back pain, unspecified: Secondary | ICD-10-CM

## 2022-07-10 LAB — POCT URINALYSIS DIPSTICK
Bilirubin, UA: NEGATIVE
Glucose, UA: NEGATIVE
Ketones, UA: POSITIVE
Nitrite, UA: NEGATIVE
Protein, UA: NEGATIVE
Spec Grav, UA: 1.01 (ref 1.010–1.025)
Urobilinogen, UA: 0.2 E.U./dL
pH, UA: 5 (ref 5.0–8.0)

## 2022-07-10 MED ORDER — CIPROFLOXACIN HCL 500 MG PO TABS: 500.0000 mg | ORAL_TABLET | Freq: Two times a day (BID) | ORAL | 0 refills | Status: AC

## 2022-07-10 NOTE — Progress Notes (Signed)
[facility] [place], [place] [address]  Internal MEDICINE  Office Visit Note  Patient Name: Rebekah Mclean  [id number]  [id number]  Date of Service: 07/10/2022  Chief Complaint  Patient presents with   right flank pain     HPI Pt is here for a sick visit. -Right flank pain started yesterday, radiates toward right side and around to front, but no abdominal tenderness -Noticed it when she came inside and sat down, no injury or anything she can identify -No urgency or burning though -Has taken some ibuprofen, helped some but not much -Did impact her sleep -No tenderness to palpation of low back or abdomen, +CVA on right though  Current Medication:  Outpatient Encounter Medications as of 07/10/2022  Medication Sig   ciprofloxacin (CIPRO) 500 MG tablet Take 1 tablet (500 mg total) by mouth 2 (two) times daily for 10 days.   Accu-Chek Softclix Lancets lancets Use as instructed to check blood sugars twice a day.  E11.65   acyclovir (ZOVIRAX) 400 MG tablet TAKE 1 TABLET BY MOUTH FIVE TIMES DAILY   carvedilol (COREG) 12.5 MG tablet TAKE 1 TABLET BY MOUTH TWICE DAILY WITH A MEAL   fluticasone (FLONASE) 50 MCG/ACT nasal spray Place 2 sprays into both nostrils daily. (Patient taking differently: Place 2 sprays into both nostrils daily as needed.)   glimepiride (AMARYL) 2 MG tablet Take 2 mg by mouth every morning.   glucose blood (ACCU-CHEK GUIDE) test strip Use as instructed to check blood sugars twice a day E11.65   levothyroxine (SYNTHROID) 88 MCG tablet Take 88 mcg by mouth daily before breakfast.   ONGLYZA 5 MG TABS tablet Take 5 mg by mouth daily.   predniSONE (STERAPRED UNI-PAK 21 TAB) 10 MG (21) TBPK tablet Take 6 tablets on day 1, 5 tablets day 2, 4 tablets day 3, 3 tablets day 4, 2 tablets day 5, 1 tablet day 6   rosuvastatin (CRESTOR) 5 MG tablet Take 1 tablet by mouth once daily   triamcinolone ointment (KENALOG) 0.5 % Apply 1 Application topically 2  (two) times daily.   valsartan-hydrochlorothiazide (DIOVAN-HCT) 160-12.5 MG tablet Take 1 tablet by mouth once daily   No facility-administered encounter medications on file as of 07/10/2022.      Medical History: Past Medical History:  Diagnosis Date   Diabetes mellitus type 2, controlled, with complications ([place])    GERD (gastroesophageal reflux disease)    Hypertension    Hypothyroidism    Irregular menses      Vital Signs: BP (!) 143/88   Pulse 97   Temp 98.2 F (36.8 C)   Resp 16   Ht 5\' 6"  (1.676 m)   Wt 138 lb (62.6 kg)   SpO2 96%   BMI 22.27 kg/m    Review of Systems  Constitutional:  Negative for fatigue and fever.  HENT:  Negative for congestion, mouth sores and postnasal drip.   Respiratory:  Negative for cough.   Cardiovascular:  Negative for chest pain.  Genitourinary:  Positive for flank pain. Negative for dysuria, frequency and urgency.  Musculoskeletal:  Positive for back pain.  Psychiatric/Behavioral: Negative.      Physical Exam Vitals and nursing note reviewed.  Constitutional:      General: She is not in acute distress.    Appearance: Normal appearance. She is well-developed and normal weight. She is not diaphoretic.  HENT:     Head: Normocephalic and atraumatic.     Mouth/Throat:  Pharynx: No oropharyngeal exudate.  Eyes:     Pupils: Pupils are equal, round, and reactive to light.  Neck:     Thyroid: No thyromegaly.     Vascular: No JVD.     Trachea: No tracheal deviation.  Cardiovascular:     Rate and Rhythm: Normal rate and regular rhythm.     Heart sounds: Normal heart sounds. No murmur heard.    No friction rub. No gallop.  Pulmonary:     Effort: Pulmonary effort is normal. No respiratory distress.     Breath sounds: No wheezing or rales.  Chest:     Chest wall: No tenderness.  Abdominal:     General: Bowel sounds are normal.     Palpations: Abdomen is soft.     Tenderness: There is no abdominal tenderness. There is right  CVA tenderness. There is no left CVA tenderness.  Musculoskeletal:        General: No tenderness. Normal range of motion.     Cervical back: Normal range of motion and neck supple.  Lymphadenopathy:     Cervical: No cervical adenopathy.  Skin:    General: Skin is warm and dry.  Neurological:     Mental Status: She is alert and oriented to person, place, and time.     Cranial Nerves: No cranial nerve deficit.  Psychiatric:        Behavior: Behavior normal.        Thought Content: Thought content normal.        Judgment: Judgment normal.       Assessment/Plan: 1. Acute right-sided low back pain, unspecified whether sciatica present Likely secondary to UTI and will treat, but advised to call or go to ED if new or worsening pain arises - POCT Urinalysis Dipstick  2. Urinary tract infection with hematuria, site unspecified Will treat with cipro and adjust based on C/S - CULTURE, URINE COMPREHENSIVE - ciprofloxacin (CIPRO) 500 MG tablet; Take 1 tablet (500 mg total) by mouth 2 (two) times daily for 10 days.  Dispense: 20 tablet; Refill: 0   General Counseling: Lalanya verbalizes understanding of the findings of todays visit and agrees with plan of treatment. I have discussed any further diagnostic evaluation that may be needed or ordered today. We also reviewed her medications today. she has been encouraged to call the office with any questions or concerns that should arise related to todays visit.    Counseling:    Orders Placed This Encounter  Procedures   CULTURE, URINE COMPREHENSIVE   POCT Urinalysis Dipstick    Meds ordered this encounter  Medications   ciprofloxacin (CIPRO) 500 MG tablet    Sig: Take 1 tablet (500 mg total) by mouth 2 (two) times daily for 10 days.    Dispense:  20 tablet    Refill:  0    Time spent:30 Minutes

## 2022-07-13 LAB — CULTURE, URINE COMPREHENSIVE

## 2022-07-17 ENCOUNTER — Telehealth: Payer: Self-pay

## 2022-07-17 NOTE — Telephone Encounter (Signed)
-----   Message from Mylinda Latina, PA-C sent at 07/14/2022  2:01 PM EDT ----- Please let her know that her urine came back with mixed urogenital flora which doesn't usually require ABX

## 2022-07-17 NOTE — Telephone Encounter (Signed)
Pt advised about urine result

## 2022-07-24 ENCOUNTER — Other Ambulatory Visit: Payer: Self-pay | Admitting: Physician Assistant

## 2022-08-08 ENCOUNTER — Other Ambulatory Visit: Payer: Self-pay

## 2022-08-08 DIAGNOSIS — I10 Essential (primary) hypertension: Secondary | ICD-10-CM

## 2022-08-08 DIAGNOSIS — E782 Mixed hyperlipidemia: Secondary | ICD-10-CM

## 2022-08-08 MED ORDER — VALSARTAN-HYDROCHLOROTHIAZIDE 160-12.5 MG PO TABS: 1.0000 | ORAL_TABLET | Freq: Every day | ORAL | 0 refills | Status: DC

## 2022-08-08 MED ORDER — CARVEDILOL 12.5 MG PO TABS: 12.5000 mg | ORAL_TABLET | Freq: Two times a day (BID) | ORAL | 1 refills | Status: DC

## 2022-08-08 MED ORDER — ROSUVASTATIN CALCIUM 5 MG PO TABS: 5.0000 mg | ORAL_TABLET | Freq: Every day | ORAL | 1 refills | Status: DC

## 2022-09-07 ENCOUNTER — Encounter: Payer: Self-pay | Admitting: Physician Assistant

## 2022-09-07 ENCOUNTER — Ambulatory Visit (INDEPENDENT_AMBULATORY_CARE_PROVIDER_SITE_OTHER): Payer: BC Managed Care – PPO | Admitting: Physician Assistant

## 2022-09-07 DIAGNOSIS — I10 Essential (primary) hypertension: Secondary | ICD-10-CM | POA: Diagnosis not present

## 2022-09-07 DIAGNOSIS — J300 Vasomotor rhinitis: Secondary | ICD-10-CM

## 2022-09-07 MED ORDER — VALSARTAN-HYDROCHLOROTHIAZIDE 160-12.5 MG PO TABS: 1.0000 | ORAL_TABLET | Freq: Every day | ORAL | 1 refills | Status: DC

## 2022-09-07 MED ORDER — FLUTICASONE PROPIONATE 50 MCG/ACT NA SUSP
2.0000 | Freq: Every day | NASAL | 3 refills | Status: DC | PRN
Start: 2022-09-07 — End: 2023-01-08

## 2022-09-07 NOTE — Progress Notes (Signed)
[facility] [address] [place], [place] [address]  Internal MEDICINE  Office Visit Note  Patient Name: Rebekah Mclean  [id number]  [id number]  Date of Service: 09/19/2022  Chief Complaint  Patient presents with   Follow-up   Diabetes   Gastroesophageal Reflux   Hypertension    HPI Pt is here for routine follow up -Doing well , no complaints today -BP stable at home normally 120s/70s -Bp the same on recheck and will keep monitoring -No recent changes  Current Medication: Outpatient Encounter Medications as of 09/07/2022  Medication Sig   Accu-Chek Softclix Lancets lancets Use as instructed to check blood sugars twice a day.  E11.65   acyclovir (ZOVIRAX) 400 MG tablet TAKE 1 TABLET BY MOUTH 5 TIMES DAILY   carvedilol (COREG) 12.5 MG tablet Take 1 tablet (12.5 mg total) by mouth 2 (two) times daily with a meal.   glimepiride (AMARYL) 2 MG tablet Take 2 mg by mouth every morning.   glucose blood (ACCU-CHEK GUIDE) test strip Use as instructed to check blood sugars twice a day E11.65   levothyroxine (SYNTHROID) 88 MCG tablet Take 88 mcg by mouth daily before breakfast.   rosuvastatin (CRESTOR) 5 MG tablet Take 1 tablet (5 mg total) by mouth daily.   triamcinolone ointment (KENALOG) 0.5 % Apply 1 Application topically 2 (two) times daily.   [DISCONTINUED] fluticasone (FLONASE) 50 MCG/ACT nasal spray Place 2 sprays into both nostrils daily. (Patient taking differently: Place 2 sprays into both nostrils daily as needed.)   [DISCONTINUED] ONGLYZA 5 MG TABS tablet Take 5 mg by mouth daily.   [DISCONTINUED] predniSONE (STERAPRED UNI-PAK 21 TAB) 10 MG (21) TBPK tablet Take 6 tablets on day 1, 5 tablets day 2, 4 tablets day 3, 3 tablets day 4, 2 tablets day 5, 1 tablet day 6   [DISCONTINUED] valsartan-hydrochlorothiazide (DIOVAN-HCT) 160-12.5 MG tablet Take 1 tablet by mouth daily.   fluticasone (FLONASE) 50 MCG/ACT nasal spray Place 2 sprays into both nostrils daily as needed.    valsartan-hydrochlorothiazide (DIOVAN-HCT) 160-12.5 MG tablet Take 1 tablet by mouth daily.   No facility-administered encounter medications on file as of 09/07/2022.    Surgical History: History reviewed. No pertinent surgical history.  Medical History: Past Medical History:  Diagnosis Date   Diabetes mellitus type 2, controlled, with complications (HCC)    GERD (gastroesophageal reflux disease)    Hypertension    Hypothyroidism    Irregular menses     Family History: Family History  Problem Relation Age of Onset   Diabetes Mother    Hypertension Mother    Heart disease Father    Hypertension Father    Diabetes Sister    Breast cancer Neg Hx     Social History   Socioeconomic History   Marital status: Married    Spouse name: Not on file   Number of children: Not on file   Years of education: Not on file   Highest education level: Not on file  Occupational History   Not on file  Tobacco Use   Smoking status: Never   Smokeless tobacco: Never  Vaping Use   Vaping Use: Never used  Substance and Sexual Activity   Alcohol use: No   Drug use: No   Sexual activity: Yes    Birth control/protection: None  Other Topics Concern   Not on file  Social History Narrative   Not on file   Social Determinants of Health   Financial Resource Strain: Not on  file  Food Insecurity: Not on file  Transportation Needs: Not on file  Physical Activity: Not on file  Stress: Not on file  Social Connections: Not on file  Intimate Partner Violence: Not on file      Review of Systems  Constitutional:  Negative for chills, fatigue and unexpected weight change.  HENT:  Negative for congestion, rhinorrhea, sneezing and sore throat.   Eyes:  Negative for redness.  Respiratory:  Negative for cough, chest tightness and shortness of breath.   Cardiovascular:  Negative for chest pain and palpitations.  Gastrointestinal:  Negative for abdominal pain, constipation, diarrhea, nausea  and vomiting.  Genitourinary:  Negative for dysuria and frequency.  Musculoskeletal:  Negative for arthralgias, back pain, joint swelling and neck pain.  Skin:  Negative for rash.  Neurological: Negative.  Negative for tremors and numbness.  Hematological:  Negative for adenopathy. Does not bruise/bleed easily.  Psychiatric/Behavioral:  Negative for behavioral problems (Depression), sleep disturbance and suicidal ideas. The patient is not nervous/anxious.     Vital Signs: BP (!) 120/90   Pulse 89   Temp 98.4 F (36.9 C)   Resp 16   Ht 5\' 6"  (1.676 m)   Wt 136 lb 9.6 oz (62 kg)   SpO2 97%   BMI 22.05 kg/m    Physical Exam Vitals and nursing note reviewed.  Constitutional:      General: She is not in acute distress.    Appearance: Normal appearance. She is well-developed and normal weight. She is not diaphoretic.  HENT:     Head: Normocephalic and atraumatic.     Mouth/Throat:     Pharynx: No oropharyngeal exudate.  Eyes:     Pupils: Pupils are equal, round, and reactive to light.  Neck:     Thyroid: No thyromegaly.     Vascular: No JVD.     Trachea: No tracheal deviation.  Cardiovascular:     Rate and Rhythm: Normal rate and regular rhythm.     Heart sounds: Normal heart sounds. No murmur heard.    No friction rub. No gallop.  Pulmonary:     Effort: Pulmonary effort is normal. No respiratory distress.     Breath sounds: No wheezing or rales.  Chest:     Chest wall: No tenderness.  Abdominal:     General: Bowel sounds are normal.     Palpations: Abdomen is soft.  Musculoskeletal:        General: Normal range of motion.     Cervical back: Normal range of motion and neck supple.  Lymphadenopathy:     Cervical: No cervical adenopathy.  Skin:    General: Skin is warm and dry.  Neurological:     Mental Status: She is alert and oriented to person, place, and time.     Cranial Nerves: No cranial nerve deficit.  Psychiatric:        Behavior: Behavior normal.         Thought Content: Thought content normal.        Judgment: Judgment normal.        Assessment/Plan: 1. Essential hypertension Borderline elevated in office, but well controlled at home. Continue current medication and monitoring - valsartan-hydrochlorothiazide (DIOVAN-HCT) 160-12.5 MG tablet; Take 1 tablet by mouth daily.  Dispense: 90 tablet; Refill: 1  2. Vasomotor rhinitis - fluticasone (FLONASE) 50 MCG/ACT nasal spray; Place 2 sprays into both nostrils daily as needed.  Dispense: 16 g; Refill: 3   General Counseling: Sherol verbalizes understanding of the  findings of todays visit and agrees with plan of treatment. I have discussed any further diagnostic evaluation that may be needed or ordered today. We also reviewed her medications today. she has been encouraged to call the office with any questions or concerns that should arise related to todays visit.    No orders of the defined types were placed in this encounter.   Meds ordered this encounter  Medications   valsartan-hydrochlorothiazide (DIOVAN-HCT) 160-12.5 MG tablet    Sig: Take 1 tablet by mouth daily.    Dispense:  90 tablet    Refill:  1   fluticasone (FLONASE) 50 MCG/ACT nasal spray    Sig: Place 2 sprays into both nostrils daily as needed.    Dispense:  16 g    Refill:  3    This patient was seen by Lynn Ito, PA-C in collaboration with Dr. Beverely Risen as a part of collaborative care agreement.   Total time spent:30 Minutes Time spent includes review of chart, medications, test results, and follow up plan with the patient.      Dr Lyndon Code Internal medicine

## 2022-09-21 ENCOUNTER — Encounter: Payer: Self-pay | Admitting: Physician Assistant

## 2022-09-21 ENCOUNTER — Ambulatory Visit (INDEPENDENT_AMBULATORY_CARE_PROVIDER_SITE_OTHER): Payer: BC Managed Care – PPO | Admitting: Physician Assistant

## 2022-09-21 VITALS — BP 122/85 | HR 50 | Temp 98.4°F | Resp 16 | Ht 66.0 in | Wt 137.6 lb

## 2022-09-21 DIAGNOSIS — L02421 Furuncle of right axilla: Secondary | ICD-10-CM | POA: Diagnosis not present

## 2022-09-21 MED ORDER — DOXYCYCLINE HYCLATE 100 MG PO TABS
100.0000 mg | ORAL_TABLET | Freq: Two times a day (BID) | ORAL | 0 refills | Status: DC
Start: 2022-09-21 — End: 2022-10-25

## 2022-09-21 NOTE — Progress Notes (Signed)
Rebekah Mclean - [facility] [address] [place], [place] [address]  Internal MEDICINE  Office Visit Note  Patient Name: Rebekah Mclean  [id number]  [id number]  Date of Service: 09/26/2022  Chief Complaint  Patient presents with   Acute Visit   Cyst    Under right arm     HPI Pt is here for a sick visit. -Under right arm boil popped up about a week ago, never had this before -Sore in the area, started draining the last few days and is improving, but now another next to it. -HR on low side, denies feeling feeling dizzy or lightheaded  Current Medication:  Outpatient Encounter Medications as of 09/21/2022  Medication Sig   Accu-Chek Softclix Lancets lancets Use as instructed to check blood sugars twice a day.  E11.65   acyclovir (ZOVIRAX) 400 MG tablet TAKE 1 TABLET BY MOUTH 5 TIMES DAILY   carvedilol (COREG) 12.5 MG tablet Take 1 tablet (12.5 mg total) by mouth 2 (two) times daily with a meal.   doxycycline (VIBRA-TABS) 100 MG tablet Take 1 tablet (100 mg total) by mouth 2 (two) times daily.   fluticasone (FLONASE) 50 MCG/ACT nasal spray Place 2 sprays into both nostrils daily as needed.   glimepiride (AMARYL) 2 MG tablet Take 2 mg by mouth every morning.   glucose blood (ACCU-CHEK GUIDE) test strip Use as instructed to check blood sugars twice a day E11.65   levothyroxine (SYNTHROID) 88 MCG tablet Take 88 mcg by mouth daily before breakfast.   rosuvastatin (CRESTOR) 5 MG tablet Take 1 tablet (5 mg total) by mouth daily.   triamcinolone ointment (KENALOG) 0.5 % Apply 1 Application topically 2 (two) times daily.   valsartan-hydrochlorothiazide (DIOVAN-HCT) 160-12.5 MG tablet Take 1 tablet by mouth daily.   No facility-administered encounter medications on file as of 09/21/2022.      Medical History: Past Medical History:  Diagnosis Date   Diabetes mellitus type 2, controlled, with complications (HCC)    GERD (gastroesophageal reflux disease)    Hypertension     Hypothyroidism    Irregular menses      Vital Signs: BP 122/85   Pulse (!) 50   Temp 98.4 F (36.9 C)   Resp 16   Ht 5\' 6"  (1.676 m)   Wt 137 lb 9.6 oz (62.4 kg)   SpO2 99%   BMI 22.21 kg/m    Review of Systems  Constitutional:  Negative for fatigue and fever.  HENT:  Negative for congestion, mouth sores and postnasal drip.   Respiratory:  Negative for cough.   Cardiovascular:  Negative for chest pain.  Genitourinary:  Negative for flank pain.  Skin:  Positive for wound.       Boils under right arm  Psychiatric/Behavioral: Negative.      Physical Exam Vitals and nursing note reviewed.  Constitutional:      Appearance: Normal appearance. She is normal weight.  HENT:     Head: Normocephalic and atraumatic.  Eyes:     Pupils: Pupils are equal, round, and reactive to light.  Cardiovascular:     Rate and Rhythm: Regular rhythm. Bradycardia present.  Pulmonary:     Effort: Pulmonary effort is normal.     Breath sounds: Normal breath sounds.  Skin:    Findings: Lesion present.     Comments: 1 small draining lesion in right axilla, with smaller non-draining lesion adjacent  Neurological:     Mental Status: She is alert.  Psychiatric:  Mood and Affect: Mood normal.        Behavior: Behavior normal.       Assessment/Plan: 1. Furuncle of right axilla Will start doxycycline and keep area clean and use warm compress. Call if not improving - doxycycline (VIBRA-TABS) 100 MG tablet; Take 1 tablet (100 mg total) by mouth 2 (two) times daily.  Dispense: 20 tablet; Refill: 0   General Counseling: [name] verbalizes understanding of the findings of todays visit and agrees with plan of treatment. I have discussed any further diagnostic evaluation that may be needed or ordered today. We also reviewed her medications today. she has been encouraged to call the office with any questions or concerns that should arise related to todays visit.    Counseling:    No orders  of the defined types were placed in this encounter.   Meds ordered this encounter  Medications   doxycycline (VIBRA-TABS) 100 MG tablet    Sig: Take 1 tablet (100 mg total) by mouth 2 (two) times daily.    Dispense:  20 tablet    Refill:  0    Time spent:25 Minutes

## 2022-10-23 ENCOUNTER — Other Ambulatory Visit: Payer: Self-pay | Admitting: Physician Assistant

## 2022-10-23 DIAGNOSIS — N390 Urinary tract infection, site not specified: Secondary | ICD-10-CM

## 2022-10-23 DIAGNOSIS — L02421 Furuncle of right axilla: Secondary | ICD-10-CM

## 2022-10-25 ENCOUNTER — Ambulatory Visit (INDEPENDENT_AMBULATORY_CARE_PROVIDER_SITE_OTHER): Payer: BC Managed Care – PPO | Admitting: Nurse Practitioner

## 2022-10-25 ENCOUNTER — Encounter: Payer: Self-pay | Admitting: Nurse Practitioner

## 2022-10-25 VITALS — BP 120/86 | HR 86 | Temp 98.3°F | Resp 16 | Ht 66.0 in | Wt 131.0 lb

## 2022-10-25 DIAGNOSIS — L02421 Furuncle of right axilla: Secondary | ICD-10-CM

## 2022-10-25 MED ORDER — DOXYCYCLINE HYCLATE 100 MG PO TABS
100.0000 mg | ORAL_TABLET | Freq: Two times a day (BID) | ORAL | 0 refills | Status: DC
Start: 2022-10-25 — End: 2023-02-13

## 2022-10-25 NOTE — Progress Notes (Signed)
[facility] [address] [place], [place] [address]  Internal MEDICINE  Office Visit Note  Patient Name: Rebekah Mclean  [id number]  [id number]  Date of Service: 10/25/2022  Chief Complaint  Patient presents with   Acute Visit    Boil underarm     HPI [name] presents for an acute sick visit for boil under right arm new boil on right axilla started Monday or Tuesday. Has happened before feels sore.  Recently treated in may for a boil with doxycycline which was effective.     Current Medication:  Outpatient Encounter Medications as of 10/25/2022  Medication Sig   Accu-Chek Softclix Lancets lancets Use as instructed to check blood sugars twice a day.  E11.65   acyclovir (ZOVIRAX) 400 MG tablet TAKE 1 TABLET BY MOUTH 5 TIMES DAILY   carvedilol (COREG) 12.5 MG tablet Take 1 tablet (12.5 mg total) by mouth 2 (two) times daily with a meal.   doxycycline (VIBRA-TABS) 100 MG tablet Take 1 tablet (100 mg total) by mouth 2 (two) times daily.   fluticasone (FLONASE) 50 MCG/ACT nasal spray Place 2 sprays into both nostrils daily as needed.   glimepiride (AMARYL) 2 MG tablet Take 2 mg by mouth every morning.   glucose blood (ACCU-CHEK GUIDE) test strip Use as instructed to check blood sugars twice a day E11.65   levothyroxine (SYNTHROID) 88 MCG tablet Take 88 mcg by mouth daily before breakfast.   rosuvastatin (CRESTOR) 5 MG tablet Take 1 tablet (5 mg total) by mouth daily.   triamcinolone ointment (KENALOG) 0.5 % Apply 1 Application topically 2 (two) times daily.   valsartan-hydrochlorothiazide (DIOVAN-HCT) 160-12.5 MG tablet Take 1 tablet by mouth daily.   [DISCONTINUED] doxycycline (VIBRA-TABS) 100 MG tablet Take 1 tablet (100 mg total) by mouth 2 (two) times daily.   No facility-administered encounter medications on file as of 10/25/2022.      Medical History: Past Medical History:  Diagnosis Date   Diabetes mellitus type 2, controlled, with complications (HCC)    GERD  (gastroesophageal reflux disease)    Hypertension    Hypothyroidism    Irregular menses      Vital Signs: BP 120/86   Pulse 86   Temp 98.3 F (36.8 C)   Resp 16   Ht 5\' 6"  (1.676 m)   Wt 131 lb (59.4 kg)   SpO2 97%   BMI 21.14 kg/m    Review of Systems  Constitutional:  Negative for fatigue and fever.  HENT:  Negative for congestion, mouth sores and postnasal drip.   Respiratory:  Negative for cough.   Cardiovascular:  Negative for chest pain.  Genitourinary:  Negative for flank pain.  Skin:  Positive for wound.       Boils under right arm  Psychiatric/Behavioral: Negative.      Physical Exam Vitals and nursing note reviewed.  Constitutional:      Appearance: Normal appearance. She is normal weight.  HENT:     Head: Normocephalic and atraumatic.  Eyes:     Pupils: Pupils are equal, round, and reactive to light.  Cardiovascular:     Rate and Rhythm: Normal rate and regular rhythm.  Pulmonary:     Effort: Pulmonary effort is normal.     Breath sounds: Normal breath sounds.  Skin:    Findings: Lesion present.     Comments: 1 small lesion in right axilla  Neurological:     Mental Status: She is alert.  Psychiatric:  Mood and Affect: Mood normal.        Behavior: Behavior normal.       Assessment/Plan: 1. Furuncle of right axilla 10 day course of doxycycline prescribed, call clinic or follow up if no improvement or worsening of symptoms - doxycycline (VIBRA-TABS) 100 MG tablet; Take 1 tablet (100 mg total) by mouth 2 (two) times daily.  Dispense: 20 tablet; Refill: 0   General Counseling: Rebekah Mclean verbalizes understanding of the findings of todays visit and agrees with plan of treatment. I have discussed any further diagnostic evaluation that may be needed or ordered today. We also reviewed her medications today. she has been encouraged to call the office with any questions or concerns that should arise related to todays  visit.    Counseling:    No orders of the defined types were placed in this encounter.   Meds ordered this encounter  Medications   doxycycline (VIBRA-TABS) 100 MG tablet    Sig: Take 1 tablet (100 mg total) by mouth 2 (two) times daily.    Dispense:  20 tablet    Refill:  0    Return if symptoms worsen or fail to improve.  [place] Controlled Substance Database was reviewed by me for overdose risk score (ORS)  Time spent:20 Minutes Time spent with patient included reviewing progress notes, labs, imaging studies, and discussing plan for follow up.   This patient was seen by Sallyanne Kuster, FNP-C in collaboration with Dr. Beverely Risen as a part of collaborative care agreement.  Hatley Henegar R. Tedd Sias, MSN, FNP-C Internal Medicine

## 2022-11-29 ENCOUNTER — Other Ambulatory Visit: Payer: Self-pay | Admitting: Physician Assistant

## 2022-11-29 ENCOUNTER — Encounter: Payer: Self-pay | Admitting: Physician Assistant

## 2022-11-29 DIAGNOSIS — R921 Mammographic calcification found on diagnostic imaging of breast: Secondary | ICD-10-CM

## 2022-12-15 ENCOUNTER — Ambulatory Visit: Admission: RE | Admit: 2022-12-15 | Payer: BC Managed Care – PPO | Source: Ambulatory Visit

## 2022-12-15 DIAGNOSIS — R921 Mammographic calcification found on diagnostic imaging of breast: Secondary | ICD-10-CM | POA: Diagnosis present

## 2023-01-07 ENCOUNTER — Other Ambulatory Visit: Payer: Self-pay | Admitting: Physician Assistant

## 2023-01-07 DIAGNOSIS — J300 Vasomotor rhinitis: Secondary | ICD-10-CM

## 2023-01-12 ENCOUNTER — Other Ambulatory Visit: Payer: Self-pay | Admitting: Physician Assistant

## 2023-01-12 DIAGNOSIS — E782 Mixed hyperlipidemia: Secondary | ICD-10-CM

## 2023-01-12 DIAGNOSIS — I10 Essential (primary) hypertension: Secondary | ICD-10-CM

## 2023-02-08 ENCOUNTER — Encounter: Payer: Self-pay | Admitting: Physician Assistant

## 2023-02-08 ENCOUNTER — Telehealth (INDEPENDENT_AMBULATORY_CARE_PROVIDER_SITE_OTHER): Payer: BC Managed Care – PPO | Admitting: Physician Assistant

## 2023-02-08 VITALS — Resp 16 | Ht 66.0 in

## 2023-02-08 DIAGNOSIS — J069 Acute upper respiratory infection, unspecified: Secondary | ICD-10-CM | POA: Diagnosis not present

## 2023-02-08 MED ORDER — AZITHROMYCIN 250 MG PO TABS
ORAL_TABLET | ORAL | 0 refills | Status: AC
Start: 2023-02-08 — End: 2023-02-13

## 2023-02-08 NOTE — Progress Notes (Signed)
[facility] [address] [place], [place] [address]  Internal MEDICINE  Telephone Visit  Patient Name: Rebekah Mclean  [id number]  [id number]  Date of Service: 02/08/2023  I connected with the patient at 2:30 by telephone and verified the patients identity using two identifiers.   I discussed the limitations, risks, security and privacy concerns of performing an evaluation and management service by telephone and the availability of in person appointments. I also discussed with the patient that there may be a patient responsible charge related to the service.  The patient expressed understanding and agrees to proceed.    Chief Complaint  Patient presents with   Telephone Screen    [phone], patient okay with telephone call   Telephone Assessment   Sore Throat   Ear Pain    Right ear pain    Headache   Eye Pain    Left eye irritation and redness    HPI Pt is here for virtual sick visit -symptoms started last Thursday and have been getting worse -Works in the school system -Headache, sore throat, hoarseness, right ear started hurting today and left eye is irritated as of this morning. Sinus congestion, coughing. Denies wheezing or SOB -Has tried nyquil then sudafed. Switched to Coridicin for high BP. -No fever or chills now, may be a fever last week -covid test negative  Current Medication: Outpatient Encounter Medications as of 02/08/2023  Medication Sig   Accu-Chek Softclix Lancets lancets Use as instructed to check blood sugars twice a day.  E11.65   acyclovir (ZOVIRAX) 400 MG tablet TAKE 1 TABLET BY MOUTH 5 TIMES DAILY   azithromycin (ZITHROMAX) 250 MG tablet Take 2 tablets on day 1, then 1 tablet daily on days 2 through 5   carvedilol (COREG) 12.5 MG tablet TAKE 1 TABLET (12.5MG  TOTAL) BY MOUTH TWICE A DAY WITH MEALS   doxycycline (VIBRA-TABS) 100 MG tablet Take 1 tablet (100 mg total) by mouth 2 (two) times daily.   fluticasone (FLONASE) 50 MCG/ACT  nasal spray PLACE 2 SPRAYS INTO BOTH NOSTRILS DAILY AS NEEDED.   glimepiride (AMARYL) 2 MG tablet Take 2 mg by mouth every morning.   glucose blood (ACCU-CHEK GUIDE) test strip Use as instructed to check blood sugars twice a day E11.65   levothyroxine (SYNTHROID) 88 MCG tablet Take 88 mcg by mouth daily before breakfast.   rosuvastatin (CRESTOR) 5 MG tablet TAKE 1 TABLET (5 MG TOTAL) BY MOUTH DAILY.   triamcinolone ointment (KENALOG) 0.5 % Apply 1 Application topically 2 (two) times daily.   valsartan-hydrochlorothiazide (DIOVAN-HCT) 160-12.5 MG tablet Take 1 tablet by mouth daily.   No facility-administered encounter medications on file as of 02/08/2023.    Surgical History: History reviewed. No pertinent surgical history.  Medical History: Past Medical History:  Diagnosis Date   Diabetes mellitus type 2, controlled, with complications (HCC)    GERD (gastroesophageal reflux disease)    Hypertension    Hypothyroidism    Irregular menses     Family History: Family History  Problem Relation Age of Onset   Diabetes Mother    Hypertension Mother    Heart disease Father    Hypertension Father    Diabetes Sister    Breast cancer Neg Hx     Social History   Socioeconomic History   Marital status: Married    Spouse name: Not on file   Number of children: Not on file   Years of education: Not on file   Highest education level:  Not on file  Occupational History   Not on file  Tobacco Use   Smoking status: Never   Smokeless tobacco: Never  Vaping Use   Vaping status: Never Used  Substance and Sexual Activity   Alcohol use: No   Drug use: No   Sexual activity: Yes    Birth control/protection: None  Other Topics Concern   Not on file  Social History Narrative   Not on file   Social Determinants of Health   Financial Resource Strain: Not on file  Food Insecurity: Not on file  Transportation Needs: Not on file  Physical Activity: Not on file  Stress: Not on file   Social Connections: Not on file  Intimate Partner Violence: Not on file      Review of Systems  Constitutional:  Negative for fever.  HENT:  Positive for congestion, ear pain, postnasal drip, sinus pressure, sore throat and voice change. Negative for mouth sores.   Eyes:  Negative for discharge.  Respiratory:  Positive for cough. Negative for shortness of breath and wheezing.   Cardiovascular:  Negative for chest pain.  Genitourinary:  Negative for flank pain.  Neurological:  Positive for headaches.  Psychiatric/Behavioral: Negative.      Vital Signs: Resp 16   Ht 5\' 6"  (1.676 m)   BMI 21.14 kg/m    Observation/Objective:  Pt is able to carry out conversation   Assessment/Plan: 1. Upper respiratory tract infection, unspecified type Will start on zpak and may continue OTC coricidin as needed. Advised to start on nasal spray to help congestion - azithromycin (ZITHROMAX) 250 MG tablet; Take 2 tablets on day 1, then 1 tablet daily on days 2 through 5  Dispense: 6 tablet; Refill: 0   General Counseling: September verbalizes understanding of the findings of today's phone visit and agrees with plan of treatment. I have discussed any further diagnostic evaluation that may be needed or ordered today. We also reviewed her medications today. she has been encouraged to call the office with any questions or concerns that should arise related to todays visit.    No orders of the defined types were placed in this encounter.   Meds ordered this encounter  Medications   azithromycin (ZITHROMAX) 250 MG tablet    Sig: Take 2 tablets on day 1, then 1 tablet daily on days 2 through 5    Dispense:  6 tablet    Refill:  0    Time spent:25 Minutes    Dr Lyndon Code Internal medicine

## 2023-02-13 ENCOUNTER — Telehealth: Payer: Self-pay

## 2023-02-13 ENCOUNTER — Other Ambulatory Visit: Payer: Self-pay | Admitting: Physician Assistant

## 2023-02-13 MED ORDER — AMOXICILLIN-POT CLAVULANATE 875-125 MG PO TABS: 1.0000 | ORAL_TABLET | Freq: Two times a day (BID) | ORAL | 0 refills | Status: DC

## 2023-02-13 NOTE — Telephone Encounter (Signed)
Patient notified

## 2023-02-26 ENCOUNTER — Telehealth (INDEPENDENT_AMBULATORY_CARE_PROVIDER_SITE_OTHER): Payer: BC Managed Care – PPO | Admitting: Physician Assistant

## 2023-02-26 ENCOUNTER — Encounter: Payer: Self-pay | Admitting: Physician Assistant

## 2023-02-26 VITALS — Resp 16 | Ht 66.0 in

## 2023-02-26 DIAGNOSIS — E538 Deficiency of other specified B group vitamins: Secondary | ICD-10-CM

## 2023-02-26 DIAGNOSIS — R5383 Other fatigue: Secondary | ICD-10-CM

## 2023-02-26 DIAGNOSIS — E039 Hypothyroidism, unspecified: Secondary | ICD-10-CM

## 2023-02-26 DIAGNOSIS — E559 Vitamin D deficiency, unspecified: Secondary | ICD-10-CM

## 2023-02-26 DIAGNOSIS — E782 Mixed hyperlipidemia: Secondary | ICD-10-CM

## 2023-02-26 DIAGNOSIS — R052 Subacute cough: Secondary | ICD-10-CM

## 2023-02-26 DIAGNOSIS — E119 Type 2 diabetes mellitus without complications: Secondary | ICD-10-CM

## 2023-02-26 NOTE — Progress Notes (Signed)
[facility] [address] [place], [place] [address]  Internal MEDICINE  Telephone Visit  Patient Name: Rebekah Mclean  [id number]  [id number]  Date of Service: 03/07/2023  I connected with the patient at 3:40 by telephone and verified the patients identity using two identifiers.   I discussed the limitations, risks, security and privacy concerns of performing an evaluation and management service by telephone and the availability of in person appointments. I also discussed with the patient that there may be a patient responsible charge related to the service.  The patient expressed understanding and agrees to proceed.    Chief Complaint  Patient presents with   Telephone Screen    [phone], prefers telephone call   Telephone Assessment   Cough   Fatigue   Labs Only    Possibly wants labs    HPI Pt is here for virtual sick visit -Still coughing, some hoarseness, still a little fatigue -Has been treated with 2 courses of ABX and went to urgent care a few days ago and was diagnosed with a subacute cough and given tessalon -no wheezing or SOB -Would like to check labs and offered chest xray to ensure no acute process. Discussed that cough can sometimes last for weeks after treatment for infection -has name for new endo, but has not gotten in yet since last endo retired. Will therefore go ahead and order thyroid labs and A1c with other labs today for monitoring  Current Medication: Outpatient Encounter Medications as of 02/26/2023  Medication Sig   Accu-Chek Softclix Lancets lancets Use as instructed to check blood sugars twice a day.  E11.65   acyclovir (ZOVIRAX) 400 MG tablet TAKE 1 TABLET BY MOUTH 5 TIMES DAILY   carvedilol (COREG) 12.5 MG tablet TAKE 1 TABLET (12.5MG  TOTAL) BY MOUTH TWICE A DAY WITH MEALS   fluticasone (FLONASE) 50 MCG/ACT nasal spray PLACE 2 SPRAYS INTO BOTH NOSTRILS DAILY AS NEEDED.   glimepiride (AMARYL) 2 MG tablet Take 2 mg by mouth every  morning.   glucose blood (ACCU-CHEK GUIDE) test strip Use as instructed to check blood sugars twice a day E11.65   levothyroxine (SYNTHROID) 88 MCG tablet Take 88 mcg by mouth daily before breakfast.   rosuvastatin (CRESTOR) 5 MG tablet TAKE 1 TABLET (5 MG TOTAL) BY MOUTH DAILY.   triamcinolone ointment (KENALOG) 0.5 % Apply 1 Application topically 2 (two) times daily.   valsartan-hydrochlorothiazide (DIOVAN-HCT) 160-12.5 MG tablet Take 1 tablet by mouth daily.   [DISCONTINUED] amoxicillin-clavulanate (AUGMENTIN) 875-125 MG tablet Take 1 tablet by mouth 2 (two) times daily. Take with food.   No facility-administered encounter medications on file as of 02/26/2023.    Surgical History: History reviewed. No pertinent surgical history.  Medical History: Past Medical History:  Diagnosis Date   Diabetes mellitus type 2, controlled, with complications (HCC)    GERD (gastroesophageal reflux disease)    Hypertension    Hypothyroidism    Irregular menses     Family History: Family History  Problem Relation Age of Onset   Diabetes Mother    Hypertension Mother    Heart disease Father    Hypertension Father    Diabetes Sister    Breast cancer Neg Hx     Social History   Socioeconomic History   Marital status: Married    Spouse name: Not on file   Number of children: Not on file   Years of education: Not on file   Highest education level: Not on file  Occupational  History   Not on file  Tobacco Use   Smoking status: Never   Smokeless tobacco: Never  Vaping Use   Vaping status: Never Used  Substance and Sexual Activity   Alcohol use: No   Drug use: No   Sexual activity: Yes    Birth control/protection: None  Other Topics Concern   Not on file  Social History Narrative   Not on file   Social Determinants of Health   Financial Resource Strain: Not on file  Food Insecurity: Not on file  Transportation Needs: Not on file  Physical Activity: Not on file  Stress: Not on  file  Social Connections: Not on file  Intimate Partner Violence: Not on file      Review of Systems  Constitutional:  Positive for fatigue. Negative for fever.  HENT:  Positive for voice change. Negative for congestion and ear pain.   Respiratory:  Positive for cough. Negative for shortness of breath and wheezing.   Cardiovascular:  Negative for chest pain.  Genitourinary:  Negative for flank pain.  Psychiatric/Behavioral: Negative.      Vital Signs: Resp 16   Ht 5\' 6"  (1.676 m)   BMI 21.14 kg/m    Observation/Objective:  Pt is able to carry out conversation   Assessment/Plan: 1. Subacute cough Will check chest xray and pt may continue with cough medication. - DG Chest 2 View; Future  2. Vitamin D deficiency - VITAMIN D 25 Hydroxy (Vit-D Deficiency, Fractures)  3. Mixed hyperlipidemia - Lipid Panel With LDL/HDL Ratio  4. Acquired hypothyroidism - TSH + free T4  5. B12 deficiency - B12 and Folate Panel  6. Other fatigue - CBC w/Diff/Platelet - Comprehensive metabolic panel - TSH + free T4 - Lipid Panel With LDL/HDL Ratio - B12 and Folate Panel - VITAMIN D 25 Hydroxy (Vit-D Deficiency, Fractures) - Fe+TIBC+Fer - Hgb A1C w/o eAG  7. Type 2 diabetes mellitus without complication, without long-term current use of insulin (HCC) - Hgb A1C w/o eAG   General Counseling: Rebekah Mclean verbalizes understanding of the findings of today's phone visit and agrees with plan of treatment. I have discussed any further diagnostic evaluation that may be needed or ordered today. We also reviewed her medications today. she has been encouraged to call the office with any questions or concerns that should arise related to todays visit.    Orders Placed This Encounter  Procedures   DG Chest 2 View   CBC w/Diff/Platelet   Comprehensive metabolic panel   TSH + free T4   Lipid Panel With LDL/HDL Ratio   B12 and Folate Panel   VITAMIN D 25 Hydroxy (Vit-D Deficiency, Fractures)    Fe+TIBC+Fer   Hgb A1C w/o eAG    No orders of the defined types were placed in this encounter.   Time spent:30 Minutes    Dr Lyndon Code Internal medicine

## 2023-02-28 ENCOUNTER — Ambulatory Visit
Admission: RE | Admit: 2023-02-28 | Discharge: 2023-02-28 | Disposition: A | Discharge status: Home / Self Care | Payer: BC Managed Care – PPO | Source: Ambulatory Visit | Attending: Physician Assistant

## 2023-02-28 DIAGNOSIS — R052 Subacute cough: Secondary | ICD-10-CM

## 2023-03-01 ENCOUNTER — Telehealth: Payer: Self-pay

## 2023-03-01 LAB — CBC WITH DIFFERENTIAL/PLATELET
Basophils Absolute: 0.1 10*3/uL (ref 0.0–0.2)
Basos: 1 %
EOS (ABSOLUTE): 0.2 10*3/uL (ref 0.0–0.4)
Eos: 3 %
Hematocrit: 39.9 % (ref 34.0–46.6)
Hemoglobin: 12.8 g/dL (ref 11.1–15.9)
Immature Grans (Abs): 0 10*3/uL (ref 0.0–0.1)
Immature Granulocytes: 0 %
Lymphocytes Absolute: 2 10*3/uL (ref 0.7–3.1)
Lymphs: 31 %
MCH: 26.9 pg (ref 26.6–33.0)
MCHC: 32.1 g/dL (ref 31.5–35.7)
MCV: 84 fL (ref 79–97)
Monocytes Absolute: 0.5 10*3/uL (ref 0.1–0.9)
Monocytes: 8 %
Neutrophils Absolute: 3.7 10*3/uL (ref 1.4–7.0)
Neutrophils: 57 %
Platelets: 275 10*3/uL (ref 150–450)
RBC: 4.76 x10E6/uL (ref 3.77–5.28)
RDW: 12.7 % (ref 11.7–15.4)
WBC: 6.4 10*3/uL (ref 3.4–10.8)

## 2023-03-01 LAB — VITAMIN D 25 HYDROXY (VIT D DEFICIENCY, FRACTURES): Vit D, 25-Hydroxy: 45.5 ng/mL (ref 30.0–100.0)

## 2023-03-01 LAB — B12 AND FOLATE PANEL
Folate: 13.1 ng/mL (ref >3.0)
Vitamin B-12: 795 pg/mL (ref 232–1245)

## 2023-03-01 LAB — COMPREHENSIVE METABOLIC PANEL
ALT: 15 [IU]/L (ref 0–32)
AST: 21 IU/L (ref 0–40)
Albumin: 4.3 g/dL (ref 3.8–4.9)
Alkaline Phosphatase: 73 [IU]/L (ref 44–121)
BUN/Creatinine Ratio: 20 (ref 9–23)
BUN: 14 mg/dL (ref 6–24)
Bilirubin Total: 0.4 mg/dL (ref 0.0–1.2)
CO2: 24 mmol/L (ref 20–29)
Calcium: 9.7 mg/dL (ref 8.7–10.2)
Chloride: 101 mmol/L (ref 96–106)
Creatinine, Ser: 0.7 mg/dL (ref 0.57–1.00)
Globulin, Total: 2.9 g/dL (ref 1.5–4.5)
Glucose: 121 mg/dL — ABNORMAL HIGH (ref 70–99)
Potassium: 4.2 mmol/L (ref 3.5–5.2)
Sodium: 137 mmol/L (ref 134–144)
Total Protein: 7.2 g/dL (ref 6.0–8.5)
eGFR: 104 mL/min/{1.73_m2} (ref >59)

## 2023-03-01 LAB — TSH+FREE T4
Free T4: 1.38 ng/dL (ref 0.82–1.77)
TSH: 1.34 u[IU]/mL (ref 0.450–4.500)

## 2023-03-01 LAB — LIPID PANEL WITH LDL/HDL RATIO
Cholesterol, Total: 197 mg/dL (ref 100–199)
HDL: 65 mg/dL (ref >39)
LDL Chol Calc (NIH): 115 mg/dL — ABNORMAL HIGH (ref 0–99)
LDL/HDL Ratio: 1.8 ratio (ref 0.0–3.2)
Triglycerides: 97 mg/dL (ref 0–149)
VLDL Cholesterol Cal: 17 mg/dL (ref 5–40)

## 2023-03-01 LAB — IRON,TIBC AND FERRITIN PANEL
Ferritin: 57 ng/mL (ref 15–150)
Iron Saturation: 26 % (ref 15–55)
Iron: 102 ug/dL (ref 27–159)
Total Iron Binding Capacity: 385 ug/dL (ref 250–450)
UIBC: 283 ug/dL (ref 131–425)

## 2023-03-01 LAB — HGB A1C W/O EAG: Hgb A1c MFr Bld: 6.8 % — ABNORMAL HIGH (ref 4.8–5.6)

## 2023-03-01 NOTE — Telephone Encounter (Signed)
-----   Message from Carlean Jews sent at 02/28/2023  4:38 PM EST ----- Please let her know that xray showed no acute findings.

## 2023-03-01 NOTE — Telephone Encounter (Signed)
Notified patient that chest x-ray was normal.

## 2023-03-08 ENCOUNTER — Telehealth: Payer: Self-pay | Admitting: Physician Assistant

## 2023-03-08 NOTE — Telephone Encounter (Signed)
Left vm and sent mychart message to confirm 03/15/23 appointment-Toni

## 2023-03-09 ENCOUNTER — Other Ambulatory Visit: Payer: Self-pay | Admitting: Physician Assistant

## 2023-03-09 DIAGNOSIS — I10 Essential (primary) hypertension: Secondary | ICD-10-CM

## 2023-03-15 ENCOUNTER — Ambulatory Visit (INDEPENDENT_AMBULATORY_CARE_PROVIDER_SITE_OTHER): Payer: BC Managed Care – PPO | Admitting: Physician Assistant

## 2023-03-15 ENCOUNTER — Encounter: Payer: Self-pay | Admitting: Physician Assistant

## 2023-03-15 VITALS — BP 122/86 | HR 84 | Temp 98.0°F | Resp 16 | Ht 66.0 in | Wt 136.0 lb

## 2023-03-15 DIAGNOSIS — E119 Type 2 diabetes mellitus without complications: Secondary | ICD-10-CM

## 2023-03-15 DIAGNOSIS — Z0001 Encounter for general adult medical examination with abnormal findings: Secondary | ICD-10-CM | POA: Diagnosis not present

## 2023-03-15 NOTE — Progress Notes (Signed)
[facility] [address] [place], [place] [address]  Internal MEDICINE  Office Visit Note  Patient Name: Rebekah Mclean  [id number]  [id number]  Date of Service: 03/15/2023  Chief Complaint  Patient presents with   Annual Exam   Diabetes   Gastroesophageal Reflux   Hypertension     HPI Pt is here for routine health maintenance examination -Feeling better finally, cough has resolved -Labs reviewed: LDL elevated and will work on this, may try doubling crestor if able, also A1c at 6.8 so overall controlled but will continue to work on lowering further. Otherwise labs look good -Due for cologuard in Dec 2026, mammogram due in March--repeat diagnostic recommended last visit and will be scheduled. Advised to call if she does not get letter to schedule -urine microalbumin will be done today -Eye exam will be scheduled -Foot exam done today -BP at home 120s/70-80  Current Medication: Outpatient Encounter Medications as of 03/15/2023  Medication Sig   Accu-Chek Softclix Lancets lancets Use as instructed to check blood sugars twice a day.  E11.65   acyclovir (ZOVIRAX) 400 MG tablet TAKE 1 TABLET BY MOUTH 5 TIMES DAILY   carvedilol (COREG) 12.5 MG tablet TAKE 1 TABLET (12.5MG  TOTAL) BY MOUTH TWICE A DAY WITH MEALS   fluticasone (FLONASE) 50 MCG/ACT nasal spray PLACE 2 SPRAYS INTO BOTH NOSTRILS DAILY AS NEEDED.   glimepiride (AMARYL) 2 MG tablet Take 2 mg by mouth every morning.   glucose blood (ACCU-CHEK GUIDE) test strip Use as instructed to check blood sugars twice a day E11.65   levothyroxine (SYNTHROID) 88 MCG tablet Take 88 mcg by mouth daily before breakfast.   rosuvastatin (CRESTOR) 5 MG tablet TAKE 1 TABLET (5 MG TOTAL) BY MOUTH DAILY.   triamcinolone ointment (KENALOG) 0.5 % Apply 1 Application topically 2 (two) times daily.   valsartan-hydrochlorothiazide (DIOVAN-HCT) 160-12.5 MG tablet TAKE 1 TABLET BY MOUTH EVERY DAY   No facility-administered encounter  medications on file as of 03/15/2023.    Surgical History: History reviewed. No pertinent surgical history.  Medical History: Past Medical History:  Diagnosis Date   Diabetes mellitus type 2, controlled, with complications (HCC)    GERD (gastroesophageal reflux disease)    Hypertension    Hypothyroidism    Irregular menses     Family History: Family History  Problem Relation Age of Onset   Diabetes Mother    Hypertension Mother    Heart disease Father    Hypertension Father    Diabetes Sister    Breast cancer Neg Hx       Review of Systems  Constitutional:  Negative for chills, fatigue and unexpected weight change.  HENT:  Negative for congestion, rhinorrhea, sneezing and sore throat.   Eyes:  Negative for redness.  Respiratory:  Negative for cough, chest tightness and shortness of breath.   Cardiovascular:  Negative for chest pain and palpitations.  Gastrointestinal:  Negative for abdominal pain, constipation, diarrhea, nausea and vomiting.  Genitourinary:  Negative for dysuria and frequency.  Musculoskeletal:  Negative for arthralgias, back pain, joint swelling and neck pain.  Skin:  Negative for rash.  Neurological: Negative.  Negative for tremors and numbness.  Hematological:  Negative for adenopathy. Does not bruise/bleed easily.  Psychiatric/Behavioral:  Negative for behavioral problems (Depression), sleep disturbance and suicidal ideas. The patient is not nervous/anxious.      Vital Signs: BP 122/86 Comment: 130/90  Pulse 84   Temp 98 F (36.7 C)   Resp 16   Ht  5\' 6"  (1.676 m)   Wt 136 lb (61.7 kg)   SpO2 99%   BMI 21.95 kg/m    Physical Exam Vitals and nursing note reviewed.  Constitutional:      General: She is not in acute distress.    Appearance: Normal appearance. She is well-developed and normal weight. She is not diaphoretic.  HENT:     Head: Normocephalic and atraumatic.     Mouth/Throat:     Pharynx: No oropharyngeal exudate.  Eyes:      Pupils: Pupils are equal, round, and reactive to light.  Neck:     Thyroid: No thyromegaly.     Vascular: No JVD.     Trachea: No tracheal deviation.  Cardiovascular:     Rate and Rhythm: Normal rate and regular rhythm.     Pulses:          Dorsalis pedis pulses are 3+ on the right side and 3+ on the left side.       Posterior tibial pulses are 3+ on the right side and 3+ on the left side.     Heart sounds: Normal heart sounds. No murmur heard.    No friction rub. No gallop.  Pulmonary:     Effort: Pulmonary effort is normal. No respiratory distress.     Breath sounds: No wheezing or rales.  Chest:     Chest wall: No tenderness.  Abdominal:     General: Bowel sounds are normal.     Palpations: Abdomen is soft.     Tenderness: There is no abdominal tenderness.  Musculoskeletal:        General: Normal range of motion.     Cervical back: Normal range of motion and neck supple.     Right foot: Normal range of motion.     Left foot: Normal range of motion.  Feet:     Right foot:     Protective Sensation: 2 sites tested.  2 sites sensed.     Skin integrity: Skin integrity normal.     Toenail Condition: Right toenails are normal.     Left foot:     Protective Sensation: 2 sites tested.  2 sites sensed.     Skin integrity: Skin integrity normal.     Toenail Condition: Left toenails are normal.  Lymphadenopathy:     Cervical: No cervical adenopathy.  Skin:    General: Skin is warm and dry.  Neurological:     Mental Status: She is alert and oriented to person, place, and time.     Cranial Nerves: No cranial nerve deficit.  Psychiatric:        Behavior: Behavior normal.        Thought Content: Thought content normal.        Judgment: Judgment normal.      LABS: Recent Results (from the past 2160 hour(s))  CBC w/Diff/Platelet     Status: None   Collection Time: 02/27/23  8:22 AM  Result Value Ref Range   WBC 6.4 3.4 - 10.8 x10E3/uL   RBC 4.76 3.77 - 5.28 x10E6/uL    Hemoglobin 12.8 11.1 - 15.9 g/dL   Hematocrit 13.2 44.0 - 46.6 %   MCV 84 79 - 97 fL   MCH 26.9 26.6 - 33.0 pg   MCHC 32.1 31.5 - 35.7 g/dL   RDW 10.2 72.5 - 36.6 %   Platelets 275 150 - 450 x10E3/uL   Neutrophils 57 Not Estab. %   Lymphs 31 Not Estab. %  Monocytes 8 Not Estab. %   Eos 3 Not Estab. %   Basos 1 Not Estab. %   Neutrophils Absolute 3.7 1.4 - 7.0 x10E3/uL   Lymphocytes Absolute 2.0 0.7 - 3.1 x10E3/uL   Monocytes Absolute 0.5 0.1 - 0.9 x10E3/uL   EOS (ABSOLUTE) 0.2 0.0 - 0.4 x10E3/uL   Basophils Absolute 0.1 0.0 - 0.2 x10E3/uL   Immature Granulocytes 0 Not Estab. %   Immature Grans (Abs) 0.0 0.0 - 0.1 x10E3/uL  Comprehensive metabolic panel     Status: Abnormal   Collection Time: 02/27/23  8:22 AM  Result Value Ref Range   Glucose 121 (H) 70 - 99 mg/dL   BUN 14 6 - 24 mg/dL   Creatinine, Ser 8.46 0.57 - 1.00 mg/dL   eGFR 962 >95 MW/UXL/2.44   BUN/Creatinine Ratio 20 9 - 23   Sodium 137 134 - 144 mmol/L   Potassium 4.2 3.5 - 5.2 mmol/L   Chloride 101 96 - 106 mmol/L   CO2 24 20 - 29 mmol/L   Calcium 9.7 8.7 - 10.2 mg/dL   Total Protein 7.2 6.0 - 8.5 g/dL   Albumin 4.3 3.8 - 4.9 g/dL   Globulin, Total 2.9 1.5 - 4.5 g/dL   Bilirubin Total 0.4 0.0 - 1.2 mg/dL   Alkaline Phosphatase 73 44 - 121 IU/L   AST 21 0 - 40 IU/L   ALT 15 0 - 32 IU/L  TSH + free T4     Status: None   Collection Time: 02/27/23  8:22 AM  Result Value Ref Range   TSH 1.340 0.450 - 4.500 uIU/mL   Free T4 1.38 0.82 - 1.77 ng/dL  Lipid Panel With LDL/HDL Ratio     Status: Abnormal   Collection Time: 02/27/23  8:22 AM  Result Value Ref Range   Cholesterol, Total 197 100 - 199 mg/dL   Triglycerides 97 0 - 149 mg/dL   HDL 65 >01 mg/dL   VLDL Cholesterol Cal 17 5 - 40 mg/dL   LDL Chol Calc (NIH) 027 (H) 0 - 99 mg/dL   LDL/HDL Ratio 1.8 0.0 - 3.2 ratio    Comment:                                     LDL/HDL Ratio                                             Men  Women                                1/2 Avg.Risk  1.0    1.5                                   Avg.Risk  3.6    3.2                                2X Avg.Risk  6.2    5.0  3X Avg.Risk  8.0    6.1   B12 and Folate Panel     Status: None   Collection Time: 02/27/23  8:22 AM  Result Value Ref Range   Vitamin B-12 795 232 - 1,245 pg/mL   Folate 13.1 >3.0 ng/mL    Comment: A serum folate concentration of less than 3.1 ng/mL is considered to represent clinical deficiency.   VITAMIN D 25 Hydroxy (Vit-D Deficiency, Fractures)     Status: None   Collection Time: 02/27/23  8:22 AM  Result Value Ref Range   Vit D, 25-Hydroxy 45.5 30.0 - 100.0 ng/mL    Comment: Vitamin D deficiency has been defined by the Institute of Medicine and an Endocrine Society practice guideline as a level of serum 25-OH vitamin D less than 20 ng/mL (1,2). The Endocrine Society went on to further define vitamin D insufficiency as a level between 21 and 29 ng/mL (2). 1. IOM (Institute of Medicine). 2010. Dietary reference    intakes for calcium and D. [place]: The    [employer]. 2. Holick MF, Binkley [place], Bischoff-Ferrari HA, et al.    Evaluation, treatment, and prevention of vitamin D    deficiency: an Endocrine Society clinical practice    guideline. JCEM. 2011 Jul; 96(7):1911-30.   Fe+TIBC+Fer     Status: None   Collection Time: 02/27/23  8:22 AM  Result Value Ref Range   Total Iron Binding Capacity 385 250 - 450 ug/dL   UIBC 440 347 - 425 ug/dL   Iron 956 27 - 387 ug/dL   Iron Saturation 26 15 - 55 %   Ferritin 57 15 - 150 ng/mL  Hgb A1C w/o eAG     Status: Abnormal   Collection Time: 02/27/23  8:22 AM  Result Value Ref Range   Hgb A1c MFr Bld 6.8 (H) 4.8 - 5.6 %    Comment:          Prediabetes: 5.7 - 6.4          Diabetes: >6.4          Glycemic control for adults with diabetes: <7.0        Assessment/Plan: 1. Encounter for general adult medical examination with abnormal  findings CPE performed, labs reviewed, UTD on PHM  2. Type 2 diabetes mellitus without complication, without long-term current use of insulin (HCC) - Urine Microalbumin w/creat. ratio   General Counseling: Rebekah Mclean verbalizes understanding of the findings of todays visit and agrees with plan of treatment. I have discussed any further diagnostic evaluation that may be needed or ordered today. We also reviewed her medications today. she has been encouraged to call the office with any questions or concerns that should arise related to todays visit.    Counseling:    Orders Placed This Encounter  Procedures   Urine Microalbumin w/creat. ratio    No orders of the defined types were placed in this encounter.   This patient was seen by Lynn Ito, PA-C in collaboration with Dr. Beverely Risen as a part of collaborative care agreement.  Total time spent:35 Minutes  Time spent includes review of chart, medications, test results, and follow up plan with the patient.     Lyndon Code, MD  Internal Medicine

## 2023-03-17 LAB — MICROALBUMIN / CREATININE URINE RATIO
Creatinine, Urine: 81.5 mg/dL
Microalb/Creat Ratio: <4 mg/g{creat} (ref 0–29)
Microalbumin, Urine: <3 ug/mL

## 2023-05-24 ENCOUNTER — Other Ambulatory Visit: Payer: Self-pay

## 2023-05-24 MED ORDER — LEVOTHYROXINE SODIUM 88 MCG PO TABS: 88.0000 ug | ORAL_TABLET | Freq: Every day | ORAL | 2 refills | Status: DC

## 2023-05-24 MED ORDER — GLIMEPIRIDE 2 MG PO TABS: 2.0000 mg | ORAL_TABLET | Freq: Every morning | ORAL | 2 refills | Status: DC

## 2023-05-28 ENCOUNTER — Other Ambulatory Visit: Payer: Self-pay | Admitting: Physician Assistant

## 2023-05-28 DIAGNOSIS — R921 Mammographic calcification found on diagnostic imaging of breast: Secondary | ICD-10-CM

## 2023-05-28 DIAGNOSIS — Z1231 Encounter for screening mammogram for malignant neoplasm of breast: Secondary | ICD-10-CM

## 2023-07-06 ENCOUNTER — Ambulatory Visit
Admission: RE | Admit: 2023-07-06 | Discharge: 2023-07-06 | Disposition: A | Discharge status: Home / Self Care | Payer: Self-pay | Source: Ambulatory Visit | Attending: Physician Assistant | Admitting: Physician Assistant

## 2023-07-06 DIAGNOSIS — Z1231 Encounter for screening mammogram for malignant neoplasm of breast: Secondary | ICD-10-CM | POA: Insufficient documentation

## 2023-07-06 DIAGNOSIS — R921 Mammographic calcification found on diagnostic imaging of breast: Secondary | ICD-10-CM | POA: Insufficient documentation

## 2023-07-13 ENCOUNTER — Other Ambulatory Visit: Payer: Self-pay | Admitting: Physician Assistant

## 2023-07-15 ENCOUNTER — Other Ambulatory Visit: Payer: Self-pay | Admitting: Physician Assistant

## 2023-07-15 DIAGNOSIS — I10 Essential (primary) hypertension: Secondary | ICD-10-CM

## 2023-08-18 ENCOUNTER — Other Ambulatory Visit: Payer: Self-pay | Admitting: Physician Assistant

## 2023-09-05 ENCOUNTER — Other Ambulatory Visit: Payer: Self-pay | Admitting: Physician Assistant

## 2023-09-05 DIAGNOSIS — I10 Essential (primary) hypertension: Secondary | ICD-10-CM

## 2023-09-13 ENCOUNTER — Ambulatory Visit (INDEPENDENT_AMBULATORY_CARE_PROVIDER_SITE_OTHER): Payer: Medicaid Other | Admitting: Physician Assistant

## 2023-09-13 ENCOUNTER — Encounter: Payer: Self-pay | Admitting: Physician Assistant

## 2023-09-13 VITALS — BP 111/84 | HR 91 | Temp 98.7°F | Resp 16 | Ht 66.0 in | Wt 138.2 lb

## 2023-09-13 DIAGNOSIS — I10 Essential (primary) hypertension: Secondary | ICD-10-CM

## 2023-09-13 DIAGNOSIS — E119 Type 2 diabetes mellitus without complications: Secondary | ICD-10-CM | POA: Diagnosis not present

## 2023-09-13 DIAGNOSIS — E559 Vitamin D deficiency, unspecified: Secondary | ICD-10-CM

## 2023-09-13 DIAGNOSIS — E538 Deficiency of other specified B group vitamins: Secondary | ICD-10-CM

## 2023-09-13 DIAGNOSIS — R5383 Other fatigue: Secondary | ICD-10-CM

## 2023-09-13 DIAGNOSIS — E782 Mixed hyperlipidemia: Secondary | ICD-10-CM | POA: Diagnosis not present

## 2023-09-13 NOTE — Progress Notes (Signed)
 [facility] [address] [place], [place] [address]  Internal MEDICINE  Office Visit Note  Patient Name: Rebekah Mclean  [id number]  [id number]  Date of Service: 10/03/2023  Chief Complaint  Patient presents with   Follow-up   Diabetes   Gastroesophageal Reflux   Hypertension    HPI Pt is here for routine follow up -Is having some night sweats and hot flashes, may try black cohash -BP stable -Established with new endocrinology office for hypothyroid and DM. She was switched to jardiance instead of glimepiride  -tolerating crestor  nightly  -will order labs  Current Medication: Outpatient Encounter Medications as of 09/13/2023  Medication Sig   Accu-Chek Softclix Lancets lancets Use as instructed to check blood sugars twice a day.  E11.65   carvedilol  (COREG ) 12.5 MG tablet TAKE 1 TABLET (12.5MG  TOTAL) BY MOUTH TWICE A DAY WITH MEALS   empagliflozin (JARDIANCE) 10 MG TABS tablet Take 10 mg by mouth daily.   fluticasone  (FLONASE ) 50 MCG/ACT nasal spray PLACE 2 SPRAYS INTO BOTH NOSTRILS DAILY AS NEEDED.   glucose blood (ACCU-CHEK GUIDE) test strip Use as instructed to check blood sugars twice a day E11.65   rosuvastatin  (CRESTOR ) 5 MG tablet TAKE 1 TABLET (5 MG TOTAL) BY MOUTH DAILY.   SYNTHROID  88 MCG tablet TAKE 1 TABLET BY MOUTH DAILY BEFORE BREAKFAST.   triamcinolone  ointment (KENALOG ) 0.5 % Apply 1 Application topically 2 (two) times daily.   valsartan -hydrochlorothiazide  (DIOVAN -HCT) 160-12.5 MG tablet TAKE 1 TABLET BY MOUTH EVERY DAY   [DISCONTINUED] acyclovir  (ZOVIRAX ) 400 MG tablet TAKE 1 TABLET BY MOUTH 5 TIMES DAILY   [DISCONTINUED] glimepiride  (AMARYL ) 2 MG tablet TAKE 1 TABLET BY MOUTH EVERY MORNING   No facility-administered encounter medications on file as of 09/13/2023.    Surgical History: History reviewed. No pertinent surgical history.  Medical History: Past Medical History:  Diagnosis Date   Diabetes mellitus type 2, controlled, with  complications (HCC)    GERD (gastroesophageal reflux disease)    Hypertension    Hypothyroidism    Irregular menses     Family History: Family History  Problem Relation Age of Onset   Diabetes Mother    Hypertension Mother    Heart disease Father    Hypertension Father    Diabetes Sister    Breast cancer Neg Hx     Social History   Socioeconomic History   Marital status: Married    Spouse name: Not on file   Number of children: Not on file   Years of education: Not on file   Highest education level: Not on file  Occupational History   Not on file  Tobacco Use   Smoking status: Never   Smokeless tobacco: Never  Vaping Use   Vaping status: Never Used  Substance and Sexual Activity   Alcohol use: No   Drug use: No   Sexual activity: Yes    Birth control/protection: None  Other Topics Concern   Not on file  Social History Narrative   Not on file   Social Drivers of Health   Financial Resource Strain: Not on file  Food Insecurity: Not on file  Transportation Needs: Not on file  Physical Activity: Not on file  Stress: Not on file  Social Connections: Not on file  Intimate Partner Violence: Not on file      Review of Systems  Constitutional:  Negative for chills, fatigue and unexpected weight change.  HENT:  Negative for congestion, rhinorrhea, sneezing and sore throat.  Eyes:  Negative for redness.  Respiratory:  Negative for cough, chest tightness and shortness of breath.   Cardiovascular:  Negative for chest pain and palpitations.  Gastrointestinal:  Negative for abdominal pain, constipation, diarrhea, nausea and vomiting.  Genitourinary:  Negative for dysuria and frequency.  Musculoskeletal:  Negative for arthralgias, back pain, joint swelling and neck pain.  Skin:  Negative for rash.  Neurological: Negative.  Negative for tremors and numbness.  Hematological:  Negative for adenopathy. Does not bruise/bleed easily.  Psychiatric/Behavioral:  Positive  for sleep disturbance. Negative for behavioral problems (Depression) and suicidal ideas. The patient is not nervous/anxious.     Vital Signs: BP 111/84   Pulse 91   Temp 98.7 F (37.1 C)   Resp 16   Ht 5' 6 (1.676 m)   Wt 138 lb 3.2 oz (62.7 kg)   SpO2 98%   BMI 22.31 kg/m    Physical Exam Vitals and nursing note reviewed.  Constitutional:      General: She is not in acute distress.    Appearance: Normal appearance. She is well-developed and normal weight. She is not diaphoretic.  HENT:     Head: Normocephalic and atraumatic.  Eyes:     Extraocular Movements: Extraocular movements intact.  Neck:     Thyroid : No thyromegaly.     Vascular: No JVD.     Trachea: No tracheal deviation.  Cardiovascular:     Rate and Rhythm: Normal rate and regular rhythm.     Heart sounds: Normal heart sounds. No murmur heard.    No friction rub. No gallop.  Pulmonary:     Effort: Pulmonary effort is normal. No respiratory distress.     Breath sounds: No wheezing or rales.  Chest:     Chest wall: No tenderness.  Musculoskeletal:        General: Normal range of motion.  Skin:    General: Skin is warm and dry.  Neurological:     Mental Status: She is alert and oriented to person, place, and time.     Cranial Nerves: No cranial nerve deficit.  Psychiatric:        Behavior: Behavior normal.        Thought Content: Thought content normal.        Judgment: Judgment normal.        Assessment/Plan: 1. Essential hypertension (Primary) Stable, continue current medication  2. Type 2 diabetes mellitus without complication, without long-term current use of insulin (HCC) Followed by endo and reports medication adjustment  3. Vitamin D  deficiency - VITAMIN D  25 Hydroxy (Vit-D Deficiency, Fractures)  4. Mixed hyperlipidemia Continue crestor  and will update labs - Lipid Panel With LDL/HDL Ratio  5. B12 deficiency - B12 and Folate Panel  6. Other fatigue - CBC w/Diff/Platelet -  Comprehensive metabolic panel with GFR - VITAMIN D  25 Hydroxy (Vit-D Deficiency, Fractures) - B12 and Folate Panel - Fe+TIBC+Fer - Lipid Panel With LDL/HDL Ratio   General Counseling: Rebekah Mclean verbalizes understanding of the findings of todays visit and agrees with plan of treatment. I have discussed any further diagnostic evaluation that may be needed or ordered today. We also reviewed her medications today. she has been encouraged to call the office with any questions or concerns that should arise related to todays visit.    Orders Placed This Encounter  Procedures   CBC w/Diff/Platelet   Comprehensive metabolic panel with GFR   VITAMIN D  25 Hydroxy (Vit-D Deficiency, Fractures)   B12 and Folate Panel  Fe+TIBC+Fer   Lipid Panel With LDL/HDL Ratio    No orders of the defined types were placed in this encounter.   This patient was seen by Taylor Favia, PA-C in collaboration with Dr. Verneta Gone as a part of collaborative care agreement.   Total time spent:30 Minutes Time spent includes review of chart, medications, test results, and follow up plan with the patient.      Dr Fozia M Khan Internal medicine

## 2023-09-18 ENCOUNTER — Other Ambulatory Visit: Payer: Self-pay | Admitting: Physician Assistant

## 2023-09-19 NOTE — Telephone Encounter (Signed)
 Please review that ok to send

## 2023-09-20 ENCOUNTER — Other Ambulatory Visit: Payer: Self-pay

## 2023-09-20 MED ORDER — ACYCLOVIR 400 MG PO TABS: 400.0000 mg | ORAL_TABLET | Freq: Every day | ORAL | 1 refills | Status: AC

## 2024-01-24 ENCOUNTER — Other Ambulatory Visit: Payer: Self-pay | Admitting: Physician Assistant

## 2024-01-24 DIAGNOSIS — I10 Essential (primary) hypertension: Secondary | ICD-10-CM

## 2024-02-12 NOTE — Progress Notes (Addendum)
 HPI: Rebekah Mclean is seen today for f/u for diabetes.  She is a 53 y.o. female who was diagnosed with diabetes in 2023. She also has thyroid  disease that predated her diabetes. I last saw her in 6/25. In 8/25, I increased her Synthroid  dose.  She is currently on jardiance 25 mg once daily.  She was originally started on Metformin .  It was stopped due to GI side effects.  She was on Farxiga  in the past, but may have stopped due to insurance coverage  She tests her fasting sugar daily.  She says it ranges from 118-120.  Review of her diet shows that she avoids concentrated carbs for the most part.  She drinks regular soda occasionally but is trying not to drink regular as often.  She says she started walking, but hasn't been doing much exercise lately. She denies numbness/tingling/burning in her feet.  She is concerned about her diabetes.    She originally had Graves disease and was treated with RAI in 07/2010.  She has been on thyroid  hormone replacement since.  She is currently on LT4 100 mcg daily. She is concerned about her thyroid .     ROS:  No chest pain. No SOB.  Medical History: Past Medical History:  Diagnosis Date  . Hyperlipidemia   . Hypertension   . Thyroid  disease   . Type 2 diabetes mellitus (CMS/HHS-HCC)   . Vitamin D  deficiency     Surgical History: No past surgical history on file.  None  Social History:  reports that she has never smoked. She has never used smokeless tobacco. She reports that she does not drink alcohol and does not use drugs.  Married.  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   Family History: family history includes Diabetes in her father; Heart disease in her father; High blood pressure (Hypertension) in her father and mother; Stroke in her mother.  Medications: Current Outpatient Medications  Medication Sig Dispense Refill  . carvediloL  (COREG ) 12.5 MG tablet TAKE 1 TABLET BY MOUTH TWICE DAILY WITH A  MEAL    . empagliflozin (JARDIANCE) 25 mg tablet Take 1 tablet (25 mg total) by mouth once daily 30 tablet 11  . levothyroxine  (SYNTHROID ) 100 MCG tablet Take 1 tablet (100 mcg total) by mouth once daily Take on an empty stomach with a glass of water at least 30-60 minutes before breakfast. 90 tablet 3  . rosuvastatin  (CRESTOR ) 10 MG tablet Take 1 tablet (10 mg total) by mouth once daily 90 tablet 3  . valsartan -hydrochlorothiazide  (DIOVAN -HCT) 160-12.5 mg tablet Take 1 tablet by mouth once daily    . SITagliptin phosphate (JANUVIA) 100 MG tablet Take 1 tablet (100 mg total) by mouth once daily 30 tablet 11   No current facility-administered medications for this visit.    Allergies: No Known Allergies  Physical Exam: Vitals:   02/12/24 1537  BP: 128/80  Pulse: 89  SpO2: 99%  Weight: 56.7 kg (125 lb)     Body mass index is 20.18 kg/m. GENERAL: Pleasant, well-appearing female in no distress.   Physical exam otherwise deferred due to coronavirus precautions.   Labs: 07/26/2010: Nuclear medicine uptake/scan showed a 24-hour uptake of 56%.  Scan showed diffusely increased uptake in an enlarged thyroid  consistent with Graves' disease.  11 mCi of radioiodine administered on 07/25/2010 07/21/2016 thyroid  ultrasound with heterogeneous thyroid  without significant nodules.  04/01/2022:  C-peptide= 2.0.  Glucose 115.   A1c=6.3.   K/Cr/Ca= 4.2/.75/9.5.  Chol= 164/82/70/79.  LFTs nl.  TSH=3.4.  B12=795  02/27/2023:  A1c=6.8.  K/Cr/Ca=4.2/0.7/9.7.   Chol=197/97/65/115.  LFTs nl.  TSH= 1.34.  FT4=1.38.  D=45.5  07/10/2023:  A1c=7.3. Cholesterol = 200/144/66.8/104.  TSH = 3.53. 10/10/2023:  A1c = 7.3. K/Cr/Ca= 3.7/0.8/10.3.  Chol=181/177/56.8/89.  LFTs nl.  UDY=83.537.   02/12/2024: A1c = 7.2.  Assessment/Plan: 1.  Type 2 Diabetes.  Her A1c today is 7.2 Based on review of her A1c, I will start her on Januvia 100 mg daily to see if we can get her A1c under 7.  I encouraged lifestyle modifications  (exercise) and  stopping regular sodas altogether.  2.  Postablative hypothyroidism.  She is on brand-name synthroid  88 mcg once daily. Her most recent TSH was nl in 6/25. Last visit, I increased her dose to 100 mcg daily and switched her to generic name.  I will re-check her TFTs today and make adjustments as necessary.  3.  Vitamin D  deficiency.  She is currently taking 2000 units (50 mcg) of OTC D supplementation.  Her most recent D level was nl in 11/24.   4. HTN associated with DM.  Her BP is good today on carvedilol  12.5 mg twice daily and valsartan -HCTZ 160-12.5 mg once daily.   5. HLD associated with DM.  She is on rosuvastatin  10 mg once daily after I increased her dose in 3/25. Her LDL in 6/25 was 89 on that dose.   6.  Prophylaxis.  I did a foot exam in 6/25.  She saw the eye doctor [facility] [phone]) on 08/16/23. No retinopathy.   7.  She will return to clinic in 4 months.   Addendum:  02/12/2024 :  TSH =1.478.  Results sent via MyChart  02/20/2024: Patient messaged.  Took to via yesterday morning.  Evening, felt nauseous.  Was not sure if it was from the medicine..  Told her unlikely and to trying it.  Also said could not use coupon.  Told her I thought she should be able to use it as long as she does not have government insurance.  Cost was $47  This note is partially prepared by Earla Daria Messier, Scribe, in the presence of and acting as the scribe of Dr. Debby Breaker , MD.    [facility], MD

## 2024-03-06 ENCOUNTER — Other Ambulatory Visit: Payer: Self-pay | Admitting: Physician Assistant

## 2024-03-06 DIAGNOSIS — I10 Essential (primary) hypertension: Secondary | ICD-10-CM

## 2024-03-17 ENCOUNTER — Ambulatory Visit: Payer: Medicaid Other | Admitting: Physician Assistant

## 2024-03-17 ENCOUNTER — Encounter: Payer: Self-pay | Admitting: Physician Assistant

## 2024-03-17 VITALS — BP 111/70 | HR 86 | Temp 98.0°F | Resp 16 | Ht 66.0 in | Wt 124.0 lb

## 2024-03-17 DIAGNOSIS — Z0001 Encounter for general adult medical examination with abnormal findings: Secondary | ICD-10-CM

## 2024-03-17 DIAGNOSIS — I10 Essential (primary) hypertension: Secondary | ICD-10-CM

## 2024-03-17 DIAGNOSIS — E559 Vitamin D deficiency, unspecified: Secondary | ICD-10-CM

## 2024-03-17 DIAGNOSIS — R300 Dysuria: Secondary | ICD-10-CM

## 2024-03-17 DIAGNOSIS — E119 Type 2 diabetes mellitus without complications: Secondary | ICD-10-CM

## 2024-03-17 DIAGNOSIS — R921 Mammographic calcification found on diagnostic imaging of breast: Secondary | ICD-10-CM | POA: Diagnosis not present

## 2024-03-17 DIAGNOSIS — E782 Mixed hyperlipidemia: Secondary | ICD-10-CM

## 2024-03-17 DIAGNOSIS — E538 Deficiency of other specified B group vitamins: Secondary | ICD-10-CM

## 2024-03-17 DIAGNOSIS — R5383 Other fatigue: Secondary | ICD-10-CM

## 2024-03-17 DIAGNOSIS — E039 Hypothyroidism, unspecified: Secondary | ICD-10-CM

## 2024-03-17 DIAGNOSIS — N951 Menopausal and female climacteric states: Secondary | ICD-10-CM | POA: Diagnosis not present

## 2024-03-17 MED ORDER — ROSUVASTATIN CALCIUM 5 MG PO TABS: 5.0000 mg | ORAL_TABLET | Freq: Every day | ORAL | 1 refills | Status: AC

## 2024-03-17 MED ORDER — VENLAFAXINE HCL ER 37.5 MG PO CP24: 37.5000 mg | ORAL_CAPSULE | Freq: Every day | ORAL | 2 refills | Status: DC

## 2024-03-17 NOTE — Progress Notes (Signed)
 [facility] [address] [place], [place] [address]  Internal MEDICINE  Office Visit Note  Patient Name: Rebekah Mclean  [id number]  [id number]  Date of Service: 03/17/2024  Chief Complaint  Patient presents with   Annual Exam   Diabetes   Gastroesophageal Reflux   Hypertension     HPI Pt is here for routine health maintenance examination -Bp stable -followed by endocrinology for T2DM, on Jardiance and Januvia--but stopped the januvia as she was hesitant. Discussed benefits, but pt will discuss with endo further if still unsure -UTD on cologuard -due for 1 yr diagnostic mammogram in March  -still having hot flashes, tried black cohash did not help a lot. Would like to try non-hormonal option first -[facility] does foot exams  Current Medication: Outpatient Encounter Medications as of 03/17/2024  Medication Sig   acyclovir  (ZOVIRAX ) 400 MG tablet Take 1 tablet (400 mg total) by mouth 5 (five) times daily.   carvedilol  (COREG ) 12.5 MG tablet TAKE 1 TABLET (12.5MG  TOTAL) BY MOUTH TWICE A DAY WITH MEALS   empagliflozin (JARDIANCE) 25 MG TABS tablet Take by mouth daily.   fluticasone  (FLONASE ) 50 MCG/ACT nasal spray PLACE 2 SPRAYS INTO BOTH NOSTRILS DAILY AS NEEDED.   glucose blood (ACCU-CHEK GUIDE) test strip Use as instructed to check blood sugars twice a day E11.65   levothyroxine  (SYNTHROID ) 100 MCG tablet Take 100 mcg by mouth daily before breakfast.   sitaGLIPtin (JANUVIA) 100 MG tablet Take 100 mg by mouth daily.   valsartan -hydrochlorothiazide  (DIOVAN -HCT) 160-12.5 MG tablet TAKE 1 TABLET BY MOUTH EVERY DAY   venlafaxine  XR (EFFEXOR  XR) 37.5 MG 24 hr capsule Take 1 capsule (37.5 mg total) by mouth daily with breakfast.   [DISCONTINUED] Accu-Chek Softclix Lancets lancets Use as instructed to check blood sugars twice a day.  E11.65   [DISCONTINUED] empagliflozin (JARDIANCE) 10 MG TABS tablet Take 10 mg by mouth daily.   [DISCONTINUED] rosuvastatin  (CRESTOR ) 5 MG  tablet TAKE 1 TABLET (5 MG TOTAL) BY MOUTH DAILY.   [DISCONTINUED] SYNTHROID  88 MCG tablet TAKE 1 TABLET BY MOUTH DAILY BEFORE BREAKFAST.   [DISCONTINUED] triamcinolone  ointment (KENALOG ) 0.5 % Apply 1 Application topically 2 (two) times daily.   rosuvastatin  (CRESTOR ) 5 MG tablet Take 1 tablet (5 mg total) by mouth daily.   No facility-administered encounter medications on file as of 03/17/2024.    Surgical History: History reviewed. No pertinent surgical history.  Medical History: Past Medical History:  Diagnosis Date   Diabetes mellitus type 2, controlled, with complications (HCC)    GERD (gastroesophageal reflux disease)    Hypertension    Hypothyroidism    Irregular menses     Family History: Family History  Problem Relation Age of Onset   Diabetes Mother    Hypertension Mother    Heart disease Father    Hypertension Father    Diabetes Sister    Breast cancer Neg Hx       Review of Systems  Constitutional:  Negative for chills, fatigue and unexpected weight change.  HENT:  Negative for congestion, rhinorrhea, sneezing and sore throat.   Eyes:  Negative for redness.  Respiratory:  Negative for cough, chest tightness and shortness of breath.   Cardiovascular:  Negative for chest pain and palpitations.  Gastrointestinal:  Negative for abdominal pain, constipation, diarrhea, nausea and vomiting.  Genitourinary:  Negative for dysuria and frequency.  Musculoskeletal:  Negative for arthralgias, back pain, joint swelling and neck pain.  Skin:  Negative for rash.  Neurological:  Negative.  Negative for tremors and numbness.  Hematological:  Negative for adenopathy. Does not bruise/bleed easily.  Psychiatric/Behavioral:  Positive for sleep disturbance. Negative for behavioral problems (Depression) and suicidal ideas. The patient is not nervous/anxious.      Vital Signs: BP 111/70   Pulse 86   Temp 98 F (36.7 C)   Resp 16   Ht 5' 6 (1.676 m)   Wt 124 lb (56.2 kg)    SpO2 99%   BMI 20.01 kg/m    Physical Exam Vitals and nursing note reviewed.  Constitutional:      General: She is not in acute distress.    Appearance: Normal appearance. She is well-developed and normal weight. She is not diaphoretic.  HENT:     Head: Normocephalic and atraumatic.  Eyes:     Extraocular Movements: Extraocular movements intact.  Neck:     Thyroid : No thyromegaly.     Vascular: No JVD.     Trachea: No tracheal deviation.  Cardiovascular:     Rate and Rhythm: Normal rate and regular rhythm.     Heart sounds: Normal heart sounds. No murmur heard.    No friction rub. No gallop.  Pulmonary:     Effort: Pulmonary effort is normal. No respiratory distress.     Breath sounds: No wheezing or rales.  Chest:     Chest wall: No tenderness.  Musculoskeletal:        General: Normal range of motion.  Skin:    General: Skin is warm and dry.  Neurological:     Mental Status: She is alert and oriented to person, place, and time.     Cranial Nerves: No cranial nerve deficit.  Psychiatric:        Behavior: Behavior normal.        Thought Content: Thought content normal.        Judgment: Judgment normal.      LABS: No results found for this or any previous visit (from the past 2160 hours).      Assessment/Plan: 1. Encounter for general adult medical examination with abnormal findings (Primary) CPE performed, labs ordered, UTD on cologuard  2. Type 2 diabetes mellitus without complication, without long-term current use of insulin (HCC) Followed by endocrinology - Urine Microalbumin w/creat. ratio  3. Essential hypertension Stable, continue current medications  4. Breast calcification seen on mammogram - MM 3D DIAGNOSTIC MAMMOGRAM BILATERAL BREAST; Future  5. Vasomotor symptoms due to menopause Will start on effexor  to help with symptoms. Pt prefers non-hormonal tx options for now - venlafaxine  XR (EFFEXOR  XR) 37.5 MG 24 hr capsule; Take 1 capsule (37.5  mg total) by mouth daily with breakfast.  Dispense: 30 capsule; Refill: 2  6. Acquired hypothyroidism Followed by endocrinology  7. Mixed hyperlipidemia Continue crestor  - rosuvastatin  (CRESTOR ) 5 MG tablet; Take 1 tablet (5 mg total) by mouth daily.  Dispense: 90 tablet; Refill: 1  8. Vitamin D  deficiency - VITAMIN D  25 Hydroxy (Vit-D Deficiency, Fractures)  9. B12 deficiency - B12 and Folate Panel  10. Other fatigue - CBC w/Diff/Platelet - Comprehensive metabolic panel with GFR - VITAMIN D  25 Hydroxy (Vit-D Deficiency, Fractures) - B12 and Folate Panel - Fe+TIBC+Fer  11. Dysuria - UA/M w/rflx Culture, Routine   General Counseling: Brookelyn verbalizes understanding of the findings of todays visit and agrees with plan of treatment. I have discussed any further diagnostic evaluation that may be needed or ordered today. We also reviewed her medications today. she has been encouraged  to call the office with any questions or concerns that should arise related to todays visit.    Counseling:    Orders Placed This Encounter  Procedures   MM 3D DIAGNOSTIC MAMMOGRAM BILATERAL BREAST   UA/M w/rflx Culture, Routine   Urine Microalbumin w/creat. ratio   CBC w/Diff/Platelet   Comprehensive metabolic panel with GFR   VITAMIN D  25 Hydroxy (Vit-D Deficiency, Fractures)   B12 and Folate Panel   Fe+TIBC+Fer    Meds ordered this encounter  Medications   rosuvastatin  (CRESTOR ) 5 MG tablet    Sig: Take 1 tablet (5 mg total) by mouth daily.    Dispense:  90 tablet    Refill:  1   venlafaxine  XR (EFFEXOR  XR) 37.5 MG 24 hr capsule    Sig: Take 1 capsule (37.5 mg total) by mouth daily with breakfast.    Dispense:  30 capsule    Refill:  2    This patient was seen by Tinnie Pro, PA-C in collaboration with Dr. Sigrid Bathe as a part of collaborative care agreement.  Total time spent:35 Minutes  Time spent includes review of chart, medications, test results, and follow up plan  with the patient.     Sigrid CHRISTELLA Bathe, MD  Internal Medicine

## 2024-03-18 ENCOUNTER — Other Ambulatory Visit: Payer: Self-pay | Admitting: Physician Assistant

## 2024-03-18 DIAGNOSIS — R921 Mammographic calcification found on diagnostic imaging of breast: Secondary | ICD-10-CM

## 2024-03-18 DIAGNOSIS — R928 Other abnormal and inconclusive findings on diagnostic imaging of breast: Secondary | ICD-10-CM

## 2024-03-18 LAB — UA/M W/RFLX CULTURE, ROUTINE
Bilirubin, UA: NEGATIVE
Ketones, UA: NEGATIVE
Leukocytes,UA: NEGATIVE
Nitrite, UA: NEGATIVE
Protein,UA: NEGATIVE
RBC, UA: NEGATIVE
Specific Gravity, UA: >=1.03 — AB (ref 1.005–1.030)
Urobilinogen, Ur: 0.2 mg/dL (ref 0.2–1.0)
pH, UA: 5 (ref 5.0–7.5)

## 2024-03-18 LAB — MICROALBUMIN / CREATININE URINE RATIO
Creatinine, Urine: 84.6 mg/dL
Microalb/Creat Ratio: 4 mg/g{creat} (ref 0–29)
Microalbumin, Urine: 3.5 ug/mL

## 2024-03-18 LAB — MICROSCOPIC EXAMINATION
Bacteria, UA: NONE SEEN
Casts: NONE SEEN /LPF
RBC, Urine: NONE SEEN /HPF (ref 0–2)
WBC, UA: NONE SEEN /HPF (ref 0–5)

## 2024-04-11 ENCOUNTER — Ambulatory Visit: Admitting: Physician Assistant

## 2024-04-12 LAB — IRON,TIBC AND FERRITIN PANEL
Ferritin: 89 ng/mL (ref 15–150)
Iron Saturation: 26 % (ref 15–55)
Iron: 97 ug/dL (ref 27–159)
Total Iron Binding Capacity: 369 ug/dL (ref 250–450)
UIBC: 272 ug/dL (ref 131–425)

## 2024-04-12 LAB — CBC WITH DIFFERENTIAL/PLATELET
Basophils Absolute: 0.1 x10E3/uL (ref 0.0–0.2)
Basos: 1 %
EOS (ABSOLUTE): 0.1 x10E3/uL (ref 0.0–0.4)
Eos: 2 %
Hematocrit: 41.9 % (ref 34.0–46.6)
Hemoglobin: 13.2 g/dL (ref 11.1–15.9)
Immature Grans (Abs): 0 x10E3/uL (ref 0.0–0.1)
Immature Granulocytes: 0 %
Lymphocytes Absolute: 2.9 x10E3/uL (ref 0.7–3.1)
Lymphs: 34 %
MCH: 26.4 pg — ABNORMAL LOW (ref 26.6–33.0)
MCHC: 31.5 g/dL (ref 31.5–35.7)
MCV: 84 fL (ref 79–97)
Monocytes Absolute: 0.6 x10E3/uL (ref 0.1–0.9)
Monocytes: 8 %
Neutrophils Absolute: 4.7 x10E3/uL (ref 1.4–7.0)
Neutrophils: 55 %
Platelets: 226 x10E3/uL (ref 150–450)
RBC: 5 x10E6/uL (ref 3.77–5.28)
RDW: 12.2 % (ref 11.7–15.4)
WBC: 8.4 x10E3/uL (ref 3.4–10.8)

## 2024-04-12 LAB — COMPREHENSIVE METABOLIC PANEL WITH GFR
ALT: 25 IU/L (ref 0–32)
AST: 22 IU/L (ref 0–40)
Albumin: 4.6 g/dL (ref 3.8–4.9)
Alkaline Phosphatase: 75 IU/L (ref 49–135)
BUN/Creatinine Ratio: 16 (ref 9–23)
BUN: 13 mg/dL (ref 6–24)
Bilirubin Total: 0.3 mg/dL (ref 0.0–1.2)
CO2: 22 mmol/L (ref 20–29)
Calcium: 9.9 mg/dL (ref 8.7–10.2)
Chloride: 105 mmol/L (ref 96–106)
Creatinine, Ser: 0.8 mg/dL (ref 0.57–1.00)
Globulin, Total: 2.5 g/dL (ref 1.5–4.5)
Glucose: 103 mg/dL — ABNORMAL HIGH (ref 70–99)
Potassium: 4 mmol/L (ref 3.5–5.2)
Sodium: 141 mmol/L (ref 134–144)
Total Protein: 7.1 g/dL (ref 6.0–8.5)
eGFR: 88 mL/min/1.73

## 2024-04-12 LAB — B12 AND FOLATE PANEL
Folate: 15.4 ng/mL
Vitamin B-12: 577 pg/mL (ref 232–1245)

## 2024-04-12 LAB — VITAMIN D 25 HYDROXY (VIT D DEFICIENCY, FRACTURES): Vit D, 25-Hydroxy: 30.5 ng/mL (ref 30.0–100.0)

## 2024-04-29 ENCOUNTER — Ambulatory Visit: Payer: Self-pay | Admitting: Physician Assistant

## 2024-05-05 ENCOUNTER — Encounter: Payer: Self-pay | Admitting: Physician Assistant

## 2024-05-05 ENCOUNTER — Ambulatory Visit: Admitting: Physician Assistant

## 2024-05-05 VITALS — BP 135/83 | HR 94 | Temp 98.0°F | Resp 16 | Ht 66.0 in | Wt 124.2 lb

## 2024-05-05 DIAGNOSIS — E559 Vitamin D deficiency, unspecified: Secondary | ICD-10-CM | POA: Diagnosis not present

## 2024-05-05 DIAGNOSIS — N951 Menopausal and female climacteric states: Secondary | ICD-10-CM

## 2024-05-05 MED ORDER — VENLAFAXINE HCL ER 37.5 MG PO CP24: 37.5000 mg | ORAL_CAPSULE | Freq: Every day | ORAL | 2 refills | Status: AC

## 2024-05-05 NOTE — Progress Notes (Unsigned)
 [facility] [address] [place], [place] [address]  Internal MEDICINE  Office Visit Note  Patient Name: Rebekah Mclean  [id number]  [id number]  Date of Service: 05/05/2024  Chief Complaint  Patient presents with   Follow-up    HPI Pt is here for routine follow up -Some hoarseness and congestion in Dec with cough. No fever. Flu and covid negative. Doing better now, but still a little lingering -Labs reviewed: Vit D borderline  and is taking 50mcg daily, otherwise labs look good -Doing ok with effexor , feels like it is helping hot flashes. Still a little hot at night, but overall improved. A little more tired. Will switch to night.   Current Medication: Outpatient Encounter Medications as of 05/05/2024  Medication Sig   acyclovir  (ZOVIRAX ) 400 MG tablet Take 1 tablet (400 mg total) by mouth 5 (five) times daily.   carvedilol  (COREG ) 12.5 MG tablet TAKE 1 TABLET (12.5MG  TOTAL) BY MOUTH TWICE A DAY WITH MEALS   empagliflozin (JARDIANCE) 25 MG TABS tablet Take by mouth daily.   fluticasone  (FLONASE ) 50 MCG/ACT nasal spray PLACE 2 SPRAYS INTO BOTH NOSTRILS DAILY AS NEEDED.   glucose blood (ACCU-CHEK GUIDE) test strip Use as instructed to check blood sugars twice a day E11.65   levothyroxine  (SYNTHROID ) 100 MCG tablet Take 100 mcg by mouth daily before breakfast.   rosuvastatin  (CRESTOR ) 5 MG tablet Take 1 tablet (5 mg total) by mouth daily.   sitaGLIPtin (JANUVIA) 100 MG tablet Take 100 mg by mouth daily.   valsartan -hydrochlorothiazide  (DIOVAN -HCT) 160-12.5 MG tablet TAKE 1 TABLET BY MOUTH EVERY DAY   [DISCONTINUED] venlafaxine  XR (EFFEXOR  XR) 37.5 MG 24 hr capsule Take 1 capsule (37.5 mg total) by mouth daily with breakfast.   venlafaxine  XR (EFFEXOR  XR) 37.5 MG 24 hr capsule Take 1 capsule (37.5 mg total) by mouth daily with breakfast.   No facility-administered encounter medications on file as of 05/05/2024.    Surgical History: History reviewed. No pertinent surgical  history.  Medical History: Past Medical History:  Diagnosis Date   Diabetes mellitus type 2, controlled, with complications (HCC)    GERD (gastroesophageal reflux disease)    Hypertension    Hypothyroidism    Irregular menses     Family History: Family History  Problem Relation Age of Onset   Diabetes Mother    Hypertension Mother    Heart disease Father    Hypertension Father    Diabetes Sister    Breast cancer Neg Hx     Social History   Socioeconomic History   Marital status: Married    Spouse name: Not on file   Number of children: Not on file   Years of education: Not on file   Highest education level: Not on file  Occupational History   Not on file  Tobacco Use   Smoking status: Never   Smokeless tobacco: Never  Vaping Use   Vaping status: Never Used  Substance and Sexual Activity   Alcohol use: No   Drug use: No   Sexual activity: Yes    Birth control/protection: None  Other Topics Concern   Not on file  Social History Narrative   Not on file   Social Drivers of Health   Tobacco Use: Low Risk (05/05/2024)   Patient History    Smoking Tobacco Use: Never    Smokeless Tobacco Use: Never    Passive Exposure: Not on file  Financial Resource Strain: Not on file  Food Insecurity: Not on  file  Transportation Needs: Not on file  Physical Activity: Not on file  Stress: Not on file  Social Connections: Not on file  Intimate Partner Violence: Not on file  Depression (PHQ2-9): Low Risk (03/17/2024)   Depression (PHQ2-9)    PHQ-2 Score: 0  Alcohol Screen: Not on file  Housing: Unknown (10/09/2023)   Received from [facility]   Epic    Unable to Pay for Housing in the Last Year: Not on file    Number of Times Moved in the Last Year: Not on file    At any time in the past 12 months, were you homeless or living in a shelter (including now)?: No  Utilities: Not on file  Health Literacy: Not on file      Review of Systems   Constitutional:  Negative for chills, fatigue and unexpected weight change.  HENT:  Positive for postnasal drip. Negative for congestion, rhinorrhea, sneezing and sore throat.   Eyes:  Negative for redness.  Respiratory:  Negative for cough, chest tightness and shortness of breath.   Cardiovascular:  Negative for chest pain and palpitations.  Gastrointestinal:  Negative for abdominal pain, constipation, diarrhea, nausea and vomiting.  Genitourinary:  Negative for dysuria and frequency.  Musculoskeletal:  Negative for arthralgias, back pain, joint swelling and neck pain.  Skin:  Negative for rash.  Neurological: Negative.  Negative for tremors and numbness.  Hematological:  Negative for adenopathy. Does not bruise/bleed easily.  Psychiatric/Behavioral:  Positive for sleep disturbance. Negative for behavioral problems (Depression) and suicidal ideas. The patient is not nervous/anxious.     Vital Signs: BP 135/83   Pulse 94   Temp 98 F (36.7 C)   Resp 16   Ht 5' 6 (1.676 m)   Wt 124 lb 3.2 oz (56.3 kg)   SpO2 100%   BMI 20.05 kg/m    Physical Exam Vitals and nursing note reviewed.  Constitutional:      General: She is not in acute distress.    Appearance: Normal appearance. She is well-developed and normal weight. She is not diaphoretic.  HENT:     Head: Normocephalic and atraumatic.  Eyes:     Extraocular Movements: Extraocular movements intact.  Neck:     Thyroid : No thyromegaly.     Vascular: No JVD.     Trachea: No tracheal deviation.  Cardiovascular:     Rate and Rhythm: Normal rate and regular rhythm.     Heart sounds: Normal heart sounds. No murmur heard.    No friction rub. No gallop.  Pulmonary:     Effort: Pulmonary effort is normal. No respiratory distress.     Breath sounds: No wheezing or rales.  Chest:     Chest wall: No tenderness.  Musculoskeletal:        General: Normal range of motion.  Skin:    General: Skin is warm and dry.  Neurological:      Mental Status: She is alert and oriented to person, place, and time.     Cranial Nerves: No cranial nerve deficit.  Psychiatric:        Behavior: Behavior normal.        Thought Content: Thought content normal.        Judgment: Judgment normal.        Assessment/Plan: 1. Vasomotor symptoms due to menopause Improving, will continue effexor  but switch to nighttime dosing now. May need to titrate up in future - venlafaxine  XR (EFFEXOR  XR) 37.5 MG 24  hr capsule; Take 1 capsule (37.5 mg total) by mouth daily with breakfast.  Dispense: 30 capsule; Refill: 2  2. Vitamin D  deficiency (Primary) Continue Vitamin D  supplement   General Counseling: Myrl verbalizes understanding of the findings of todays visit and agrees with plan of treatment. I have discussed any further diagnostic evaluation that may be needed or ordered today. We also reviewed her medications today. she has been encouraged to call the office with any questions or concerns that should arise related to todays visit.    No orders of the defined types were placed in this encounter.   Meds ordered this encounter  Medications   venlafaxine  XR (EFFEXOR  XR) 37.5 MG 24 hr capsule    Sig: Take 1 capsule (37.5 mg total) by mouth daily with breakfast.    Dispense:  30 capsule    Refill:  2    This patient was seen by Tinnie Pro, PA-C in collaboration with Dr. Sigrid Bathe as a part of collaborative care agreement.   Total time spent:30 Minutes Time spent includes review of chart, medications, test results, and follow up plan with the patient.      Dr Fozia M Khan Internal medicine

## 2024-07-07 ENCOUNTER — Ambulatory Visit: Admitting: Physician Assistant

## 2025-03-23 ENCOUNTER — Encounter: Admitting: Physician Assistant
